# Patient Record
Sex: Female | Born: 1960
Health system: Southern US, Community
[De-identification: ages and names within clinical notes are randomized; demographics above are authoritative.]

## PROBLEM LIST (undated history)

## (undated) DIAGNOSIS — H9319 Tinnitus, unspecified ear: Secondary | ICD-10-CM

## (undated) DIAGNOSIS — B009 Herpesviral infection, unspecified: Secondary | ICD-10-CM

## (undated) DIAGNOSIS — N952 Postmenopausal atrophic vaginitis: Secondary | ICD-10-CM

## (undated) DIAGNOSIS — B3731 Acute candidiasis of vulva and vagina: Secondary | ICD-10-CM

## (undated) DIAGNOSIS — T7840XA Allergy, unspecified, initial encounter: Secondary | ICD-10-CM

## (undated) DIAGNOSIS — L723 Sebaceous cyst: Secondary | ICD-10-CM

## (undated) DIAGNOSIS — H00026 Hordeolum internum left eye, unspecified eyelid: Secondary | ICD-10-CM

## (undated) DIAGNOSIS — G4700 Insomnia, unspecified: Secondary | ICD-10-CM

## (undated) DIAGNOSIS — R251 Tremor, unspecified: Secondary | ICD-10-CM

## (undated) DIAGNOSIS — E785 Hyperlipidemia, unspecified: Secondary | ICD-10-CM

## (undated) DIAGNOSIS — G4733 Obstructive sleep apnea (adult) (pediatric): Secondary | ICD-10-CM

## (undated) DIAGNOSIS — I10 Essential (primary) hypertension: Secondary | ICD-10-CM

## (undated) HISTORY — DX: Insomnia, unspecified: G47.00

## (undated) HISTORY — DX: Essential (primary) hypertension: I10

## (undated) HISTORY — DX: Acute candidiasis of vulva and vagina: B37.31

## (undated) HISTORY — DX: Tinnitus, unspecified ear: H93.19

## (undated) HISTORY — DX: Hyperlipidemia, unspecified: E78.5

## (undated) HISTORY — DX: Herpesviral infection, unspecified: B00.9

## (undated) HISTORY — DX: Allergy, unspecified, initial encounter: T78.40XA

## (undated) HISTORY — DX: Postmenopausal atrophic vaginitis: N95.2

## (undated) HISTORY — DX: Obstructive sleep apnea (adult) (pediatric): G47.33

## (undated) HISTORY — PX: MANDIBLE RECONSTRUCTION: SHX431

## (undated) HISTORY — PX: ABDOMINAL HYSTERECTOMY: SHX81

## (undated) HISTORY — PX: ROTATOR CUFF REPAIR: SHX139

## (undated) HISTORY — DX: Hordeolum internum left eye, unspecified eyelid: H00.026

## (undated) HISTORY — PX: APPENDECTOMY: SHX54

## (undated) HISTORY — DX: Tremor, unspecified: R25.1

---
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
## 1997-11-27 ENCOUNTER — Other Ambulatory Visit: Admission: RE | Admit: 1997-11-27 | Discharge: 1997-11-27 | Payer: Self-pay | Admitting: Obstetrics & Gynecology

## 1998-05-21 ENCOUNTER — Other Ambulatory Visit: Admission: RE | Admit: 1998-05-21 | Discharge: 1998-05-21 | Payer: Self-pay | Admitting: Obstetrics & Gynecology

## 1998-12-11 ENCOUNTER — Other Ambulatory Visit: Admission: RE | Admit: 1998-12-11 | Discharge: 1998-12-11 | Payer: Self-pay | Admitting: Obstetrics & Gynecology

## 1999-12-02 ENCOUNTER — Other Ambulatory Visit: Admission: RE | Admit: 1999-12-02 | Discharge: 1999-12-02 | Payer: Self-pay | Admitting: Obstetrics & Gynecology

## 2000-12-02 ENCOUNTER — Other Ambulatory Visit: Admission: RE | Admit: 2000-12-02 | Discharge: 2000-12-02 | Payer: Self-pay | Admitting: Obstetrics & Gynecology

## 2001-01-21 ENCOUNTER — Ambulatory Visit (HOSPITAL_COMMUNITY): Admission: RE | Admit: 2001-01-21 | Discharge: 2001-01-21 | Payer: Self-pay | Admitting: Obstetrics & Gynecology

## 2001-04-07 ENCOUNTER — Encounter (INDEPENDENT_AMBULATORY_CARE_PROVIDER_SITE_OTHER): Payer: Self-pay | Admitting: Specialist

## 2001-04-07 ENCOUNTER — Inpatient Hospital Stay (HOSPITAL_COMMUNITY): Admission: RE | Admit: 2001-04-07 | Discharge: 2001-04-09 | Payer: Self-pay | Admitting: Obstetrics & Gynecology

## 2001-11-01 ENCOUNTER — Ambulatory Visit (HOSPITAL_COMMUNITY): Admission: RE | Admit: 2001-11-01 | Discharge: 2001-11-01 | Payer: Self-pay | Admitting: Obstetrics & Gynecology

## 2001-11-01 ENCOUNTER — Encounter: Payer: Self-pay | Admitting: Obstetrics & Gynecology

## 2002-03-02 HISTORY — PX: ABDOMINAL HYSTERECTOMY: SHX81

## 2003-07-16 ENCOUNTER — Encounter: Admission: RE | Admit: 2003-07-16 | Discharge: 2003-07-16 | Payer: Self-pay | Admitting: Internal Medicine

## 2005-01-28 ENCOUNTER — Ambulatory Visit: Payer: Self-pay | Admitting: Internal Medicine

## 2005-02-18 ENCOUNTER — Ambulatory Visit: Payer: Self-pay | Admitting: Internal Medicine

## 2005-08-24 ENCOUNTER — Ambulatory Visit: Payer: Self-pay | Admitting: Internal Medicine

## 2009-05-14 ENCOUNTER — Ambulatory Visit (HOSPITAL_COMMUNITY): Admission: RE | Admit: 2009-05-14 | Discharge: 2009-05-14 | Payer: Self-pay | Admitting: Family Medicine

## 2010-03-23 ENCOUNTER — Encounter: Payer: Self-pay | Admitting: Internal Medicine

## 2010-03-23 ENCOUNTER — Encounter: Payer: Self-pay | Admitting: Family Medicine

## 2010-07-18 NOTE — Discharge Summary (Signed)
[facility]  Patient:    Katelyn Carter, Katelyn Carter Visit Number: [id number] MRN: [id number]          Service Type: GYN Location: 9300 [id number] [id number] Attending Physician:  Mickle Mallory Dictated by:   Gerrit Friends. Aldona Bar, M.D. Admit Date:  04/07/2001 Discharge Date: 04/09/2001                             Discharge Summary  DISCHARGE DIAGNOSIS:          Chronic left lower quadrant pain secondary                               to adhesions involving the left tube and ovary.  PROCEDURES:                   1. Total abdominal hysterectomy.                               2. Left salpingo-oophorectomy.                               3. Right salpingectomy (right ovary previously                                  removed).  HISTORY:                      This 50 year old presented with chronic left lower quadrant pain secondary to known adhesions found on laparoscopy in the fall of 2002. The adhesions were very dense and involved the left pelvic sidewall in the area of the left ureter. Decision was made because of the patients persistent discomfort to proceed with definitive surgery and therefore, after a normal preoperative workup, she was admitted for a total abdominal hysterectomy and left salpingo-oophorectomy. Intraoperatively, it was realized that she had previous removal of the right ovary, and therefore, the right fallopian tube was removed as well. The right ovary was probably removed when the patient had an appendectomy in high school. At the time of the laparoscopy, the site of the right ovary appeared as a white fibrous mound and it was assumed that this probably represented a small right ovary. Nonetheless, intraoperatively with this procedure, palpating the area and inspecting the area carefully, it was obvious that this was the site of the right ovary which had been previously removed.  The patients postoperative course was benign. Her discharge hemoglobin  was 10.6 with a white count of 8900 and platelet count of 197,000. Pathologic specimen is still pending at the time of discharge. On the morning of February 8, she was ambulating well, tolerating a regular diet well, having normal bowel and bladder function, and was deemed ready for discharge. Accordingly, her staples were removed, the wound was Steri-Stripped with benzoin.  DISCHARGE MEDICATIONS:        She was given all prescriptions which included:                               1. Vivelle-Dot 0.5 mg.  2. Motrin 600 mg every six hours as needed for                                  pain.                               3. Tylox one to two every four to six hours as                                  needed for sever pain.                               4. Ambien 10 mg for sleep.                               5. Ferrous sulfate 300 mg daily.  DISCHARGE FOLLOWUP:           The patient will return to the office for followup in approximately three to four weeks time or as needed. She was given all explicit instructions at the time of discharge and understood all instructions well.  CONDITION ON DISCHARGE:       Improved. Dictated by:   Gerrit Friends. Aldona Bar, M.D. Attending Physician:  Mickle Mallory DD:  04/09/01 TD:  04/10/01 Job: [phone] [id number]

## 2010-07-18 NOTE — H&P (Signed)
[facility]  Patient:    Katelyn Carter, Katelyn Carter Visit Number: [id number] MRN: [id number]          Service Type: Attending:  Gerrit Friends. Aldona Bar, M.D. Dictated by:   Gerrit Friends. Aldona Bar, M.D. Adm. Date:  04/07/01                           History and Physical  TIME OF OPERATION:            7:30 a.m., Thursday, April 07, 2001.  OFFICE NUMBER:                [id number]  HISTORY OF PRESENT ILLNESS:   Katelyn Carter is a 50 year old patient admitted for a total abdominal hysterectomy and left salpingo-oophorectomy.  She is a gravida 5, para 1, aborta 4.  Because of a nagging left lower quadrant discomfort, she underwent laparoscopy in November 2002, and it was found that there were numerous pelvic adhesions involving the uterus, left tube and ovary, and some as well involving the right tube and ovary.  The adhesions involving the left tube and ovary, dome of the bladder, and uterus are likely the etiology of the patients persistent nagging discomfort.  She is admitted at this time for a total abdominal hysterectomy, lysis of adhesions, and left salpingo-oophorectomy to remedy the situation.  Her last menstrual period was approximately one week ago.  In addition to the problem with the left lower quadrant discomfort, she has in the past had a Pap smear that showed a few atypical cells (1999), but all subsequent Pap smears since then have been within normal limits.  OBSTETRICAL HISTORY:          Complicated by significant group B strep infections.  She had a pregnancy loss with her first and second pregnancies. In February 1991, she delivered a 36-week, 5 pound 6 ounce female infant who is her only child.  In February 1993 and again in June 1997, she had pregnancy loss at 5-1/2 months gestation, presumably related to chorioamnionitis, group B strep, and preterm premature rupture of membranes.  The planned procedure has been discussed with her in great detail, and she understands the  potential benefits and potential risks of such procedure.  ALLERGIES:                    No known allergies.  PAST MEDICAL HISTORY:         Previous procedures as above.  She has no serious illnesses.  Her past medical history is unremarkable with exception of the above.  MEDICATIONS:                  None on a regular basis.  SOCIAL HISTORY:               Essentially negative.  FAMILY HISTORY:               Essentially negative.  REVIEW OF SYSTEMS:            Unremarkable.  PHYSICAL EXAMINATION:  GENERAL:                      At the time of admission, a well-developed white female.  VITAL SIGNS:                  Blood pressure 110/70, pulse 80 and regular, respirations 18 and regular, temperature 98.2.  HEENT:  Negative.  NECK:                         Thyroid not enlarged.  CHEST:                        Clear.  CARDIOVASCULAR:               Regular rate and rhythm.  BREASTS:                      Negative.  ABDOMEN:                      The abdomen is soft.  There is some discomfort to deep palpation in the left lower quadrant.  PELVIC:                       Uterus upper limits of normal size, slightly tender to movement.  There is tenderness in the left adnexa, but no specific masses are appreciated.  The right adnexa has no significant masses appreciated, minimal tenderness.  Rectovaginal exam confirmatory.  EXTREMITIES:                  Negative.  NEUROLOGIC:                   Physiologic.  IMPRESSION:                   1. Known pelvic adhesions, probably associated                                  with chronic left lower quadrant pain.                               2. Previous abnormal Pap smear on one occasion.                               3. Poor obstetrical history associated with                                  significant group B Streptococcus.  PLAN:                         Total abdominal hysterectomy, lysis of adhesions, left  salpingo-oophorectomy, doubt necessity to do a right salpingo-oophorectomy.  As mentioned, the patient understands risks and benefits of the procedures and is ready to proceed. Dictated by:   Gerrit Friends. Aldona Bar, M.D. Attending:  Gerrit Friends. Aldona Bar, M.D. DD:  04/05/01 TD:  04/05/01 Job: [id number]

## 2010-07-18 NOTE — Op Note (Signed)
[facility]  Patient:    Katelyn Carter, Katelyn Carter Visit Number: [id number] MRN: [id number]          Service Type: DSU Location: [facility] Attending Physician:  Mickle Mallory Dictated by:   Gerrit Friends. Aldona Bar, M.D. Proc. Date: 01/21/01 Admit Date:  01/21/2001                             Operative Report  PREOPERATIVE DIAGNOSIS:       Chronic left lower quadrant pain.  POSTOPERATIVE DIAGNOSES:      1. Chronic left lower quadrant pain.                               2. Numerous pelvic adhesions involving the                                  uterus, left tube and ovary, right tube and                                  ovary.  PROCEDURE:                    Diagnostic laparoscopy.  SURGEON:                      Gerrit Friends. Aldona Bar, M.D.  ANESTHESIA:                   General endotracheal.  HISTORY:                      This 50 year old gravida 5, para 1, abortus 4 has persistent, sporadic, chronic left lower quadrant discomfort, and evaluation essentially was negative.  Evaluation included an ultrasound, CA-125 and pelvic exam.  She is taken to the operating room now for further evaluation.  It is of interest that, in her distant past history, she relates what she thought was a left oophorectomy, which most likely was a left oophorocystectomy.  Ultrasound did show the presence of a left and right ovary.  OPERATIVE FINDINGS:           The liver edge and undersurface of the diaphragm appeared normal.  The appendix was not visualized - surgically absent.  The uterus was globular and upper limits of normal size, consistent with a small fibroid as noted on the ultrasound.  There were numerous adhesions involving the dome of the bladder, especially on the left.  There was also what appeared to be increased vascularity in that area.  The right fallopian tube also had some increased vascularity.  The right ovary itself appeared very small. There were really no significant  adhesions involving the right tube and ovary. The left tube and ovary appeared fairly normal with the exception of terminal adhesions where the left tube was stuck densely to the left pelvic side wall. The left ovary appeared normal in size.  There was some distortion of the anatomy in the area of the left round ligament as it left the uterus, more so involved with the adhesions in the area of the dome of the bladder.  The cul-de-sac appeared fairly free.  There were some areas of increased vascularity noted on the posterior leaves of the  left broad ligament, probably secondary to the adhesions on the left side.  DESCRIPTION OF PROCEDURE:     The patient was taken to the operating room. After the satisfactory induction of general endotracheal anesthesia, she was prepped and draped, having been placed in the modified lithotomy position in the short Allen stirrups.  The bladder was drained of clear urine through a red rubber catheter in in-and-out fashion.  A Hulka tenaculum was placed on the cervix for uterine mobility during the procedure.  At this time, after the patient was adequately draped, laparoscopy was begun. A 1 cm subumbilical midline transverse skin incision was made and, through this, the large trocar and sleeve were introduced.  The large trocar was removed.  Through the large sleeve, a laparoscope was introduced, with good visualization of intra-abdominal structures.  At this time, pneumoperitoneum was created with approximately 3 L of carbon dioxide gas.  Through a 5 mm suprapubic midline transverse skin incision, the accessory trocar and sleeve were introduced under direct visualization.  Through the accessory sleeve, once the accessory trocar had been removed, the grasping instrument was introduced.  At this time, visualization of anatomy was carried out as noted in the operative findings.  It was decided, after reviewing everything, that the adhesions involving  the distal portion of the left tube were so densely adhered to the side wall that sharp dissection was probably advisable.  In addition, the adhesions involving the dome of the bladder, especially on the left, were such that sharp dissection likewise was preferable to the surgeon, so diagnostic laparoscopy was all that was entertained.  At the conclusion of the procedure, once all anatomy had been visualized, the accessory sleeve was removed under direct visualization after the grasping instrument was removed.  The undersurface of the peritoneum was dry.  The laparoscope at this time was removed from the large sleeve.  Pneumoperitoneum was reduced.  The large sleeve was removed and the skin incisions were closed with 4-0 Vicryl in interrupted subcuticular fashion.  Band-aids were applied. The Hulka tenaculum was removed from the cervix.  The patient at this time was transported to the recovery room in satisfactory condition, having tolerated the procedure well.  ESTIMATED BLOOD LOSS:         25 cc.  COUNTS:                       Correct x 2.  DISPOSITION:                  The patient will be discharged to home with a prescription for Tylox to use 1-2 q.4-6h. and Compazine rectal suppositories, 25 mg, to use one per rectum q.6-8h. as needed for nausea and vomiting.  She will be asked to return to the office in several weeks for follow up.  The option of further surgery will be entertained and discussed with the patient.  CONDITION ON ARRIVAL TO THE RECOVERY ROOM:         Satisfactory. Dictated by:   Gerrit Friends. Aldona Bar, M.D. Attending Physician:  Mickle Mallory DD:  01/21/01 TD:  01/21/01 Job: [id number]

## 2010-07-18 NOTE — Op Note (Signed)
[facility]  Patient:    Katelyn Carter, Katelyn Carter Visit Number: [id number] MRN: [id number]          Service Type: GYN Location: 9300 [id number] 01 Attending Physician:  Mickle Mallory Dictated by:   Gerrit Friends. Aldona Bar, M.D. Proc. Date: 04/07/01 Admit Date:  04/07/2001                             Operative Report  PREOPERATIVE DIAGNOSES:       Known pelvic adhesions, chronic left lower quadrant pain.  POSTOPERATIVE DIAGNOSES:      Known pelvic adhesions, chronic left lower quadrant pain.  PROCEDURE:                    Total abdominal hysterectomy, left salpingo-oophorectomy, lysis of adhesions, right salpingectomy-right ovary never identified, ?previously removed, ?congenitally absent.  What had been presumed to be right ovary on previous laparoscopy was what appeared some fibrous tissue adjacent to the right cornu of the uterus.  SURGEON:                      Gerrit Friends. Aldona Bar, M.D.  ASSISTANT:                    Teena Irani. Odis Luster, M.D.  ANESTHESIA:                   General endotracheal.  PROCEDURE:                    Patient was taken to the operating room where after the satisfactory induction of general endotracheal anesthesia she was prepped and draped having been placed in the supine position in the usual manner for abdominal surgery.  A Foley catheter was inserted as part of the prep.  Once the patient was adequately draped the procedure was begun.  A Pfannenstiel incision was made and with minimal difficulty dissected down sharply to and through the fascia in a low transverse fascia with hemostasis created at each layer.  Subfascial space was created inferiorly and superiorly.  Muscles separated in the midline.  Peritoneum identified and entered appropriate with care taken to avoid the bowel superiorly and bladder inferiorly.  At this time the upper abdomen was inspected.  No pathologic findings were noted.  The uterus itself was posterior, had a  lot of increased vascularity noted.  The left ovary, as previously noted, was stuck to the left pelvic side wall.  The left tube appeared normal.  The right fallopian tube appeared normal.  The right ovary was never specifically identified.  What was felt to be the right ovary on laparoscopy was only a fibrous mound of tissue located near the right cornu.  Originally, the plan had been to carry out a left salpingo-oophorectomy and total abdominal hysterectomy but because of the intraoperative findings, decision was made to proceed with removal of the right tube and presumably the fibrous tissue which may have been the right ovary as well as the left tube and ovary and the uterus.  Accordingly, after the retractor was placed and bowel was packed off without difficulty, hysterectomy was begun.  The corners of the uterus were elevated with long Kelly clamps and at this time the round ligaments identified, clamped, cut, suture secured, and the bladder flap dissected with the Bovie.  At this time the adhesions involving the left ovary were sharply lysed once  identification of the left ureter was carried out.  The left infundibulopelvic ligament was then isolated, clamped, cut, then doubly suture secured.  At this time the right round ligament was clamped, cut, and suture secured and the remainder of the bladder flap was dissected off of the lower segment.  The right fallopian tube appeared fairly normal.  It was fairly close to the right round ligament and again there was a little fibrous mound of tissue adjacent to the right cornu which was felt to be a small right ovary on laparoscopy, but indeed may or may not have been. Anything else resembling a right ovary was not seen in the pelvis.  Decision was made to remove the right tube in its entirety.  Accordingly, the right infundibulopelvic pedicle was isolated and after identification of the right ureter was clamped, cut, and double suture  secured.  At this time the uterine artery pedicles were skeletonized and then clamped, cut, and secure secured with 0 Vicryl suture.  Additional parametrial bites were taken in a similar fashion bilaterally down to the vaginal angle which was secured using a curved Heaney and a figure-of-eight 0 Vicryl suture.  At this time the anterior vagina was entered below the cervix and the cervix and uterus removed in its entirety.  After specimen was passed off the vaginal cuff was attended to accordingly.  The vaginal angles were secured with figure-of-eight 0 Vicryl sutures and the vaginal cuff was closed with interrupted 0 Vicryl in a figure-of-eight fashion as well.  At this time profuse irrigation was carried out with excellent hemostasis noted.  The site where the left ovary was stuck to the pelvic side wall was rendered hemostatic with a figure-of-eight 2-0 Vicryl.  Both ureters were again identified.  After the pelvis was thoroughly inspected and noted to be dry the hysterectomy portion of the procedure was over and packs were removed.  The appendix was looked for, but was noted to be surgically absent.  All packs were removed at this time as was the retractor. With all counts being correct and no foreign bodies noted to be remaining in the abdominal cavity, closure of the abdomen was then carried out in layers. The abdominoperitoneum was closed with 0 Vicryl in a running fashion, the muscles secured with same.  Assurance of subfascial hemostasis, the fascia was then reapproximated with 0 Vicryl from angle to midline bilaterally. Subcutaneous tissue was then rendered hemostatic and staples were used to close the skin.  Sterile pressure dressing was applied.  Patient at this time was transported to the recovery room condition after having tolerated procedure well.  Estimated blood loss 200 cc.  All counts correct x2.  Upon arrival to the recovery room patient had good urine output which was  clear and appeared to have tolerated the procedure well.  Condition on arrival to the recovery room:  Satisfactory. Dictated by:   Gerrit Friends. Aldona Bar, M.D.  Attending Physician:  Mickle Mallory DD:  04/07/01 TD:  04/07/01 Job: [id number]

## 2012-04-25 ENCOUNTER — Ambulatory Visit: Payer: BC Managed Care – PPO | Admitting: Emergency Medicine

## 2012-04-25 VITALS — BP 120/80 | HR 73 | Temp 98.4°F | Resp 18 | Ht 62.0 in | Wt 166.0 lb

## 2012-04-25 DIAGNOSIS — H01009 Unspecified blepharitis unspecified eye, unspecified eyelid: Secondary | ICD-10-CM

## 2012-04-25 DIAGNOSIS — H01002 Unspecified blepharitis right lower eyelid: Secondary | ICD-10-CM

## 2012-04-25 MED ORDER — TOBRAMYCIN-DEXAMETHASONE 0.3-0.1 % OP SUSP: 1.0000 [drp] | OPHTHALMIC | Status: DC

## 2012-04-25 NOTE — Progress Notes (Signed)
[facility] [address] [place] [address] [phone]  Date:  04/25/2012   Name:  Katelyn Carter   DOB:  02-14-1961   MRN:  [id number]  PCP:  No primary provider on file.    Chief Complaint: right eye irritation   History of Present Illness:  Katelyn Carter is a 52 y.o. very pleasant female patient who presents with the following:  Weekend long duration of swollen itchy uncomfortable right lower lid.  No injection, gluing or drainage. No visual symptoms no cough or coryza.  No fever or chills.  No new contact products or personal care products.  No history of injury.  There is no problem list on file for this patient.   History reviewed. No pertinent past medical history.  Past Surgical History  Procedure Laterality Date  . Appendectomy    . Abdominal hysterectomy      History  Substance Use Topics  . Smoking status: Never Smoker   . Smokeless tobacco: Not on file  . Alcohol Use: 1.5 oz/week    3 drink(s) per week    Family History  Problem Relation Age of Onset  . Hyperlipidemia Mother   . Cancer Father     kidney    Allergies  Allergen Reactions  . Sudafed (Pseudoephedrine Hcl)     jittery    Medication list has been reviewed and updated.  No current outpatient prescriptions on file prior to visit.   No current facility-administered medications on file prior to visit.    Review of Systems:  As per HPI, otherwise negative.    Physical Examination: Filed Vitals:   04/25/12 1144  BP: 120/80  Pulse: 73  Temp: 98.4 F (36.9 C)  Resp: 18   Filed Vitals:   04/25/12 1144  Height: 5\' 2"  (1.575 m)  Weight: 166 lb (75.297 kg)   Body mass index is 30.35 kg/(m^2). Ideal Body Weight: Weight in (lb) to have BMI = 25: 136.4   GEN: WDWN, NAD, Non-toxic, Alert & Oriented x 3 HEENT: Atraumatic, Normocephalic. Right lower lid swollen and red. Ears and Nose: No external deformity. EXTR: No clubbing/cyanosis/edema NEURO:  Normal gait.  PSYCH: Normally interactive. Conversant. Not depressed or anxious appearing.  Calm demeanor.    Assessment and Plan: Blepharitis   Carmelina Dane, MD

## 2013-05-08 ENCOUNTER — Encounter: Payer: Self-pay | Admitting: General Surgery

## 2013-05-08 DIAGNOSIS — E669 Obesity, unspecified: Secondary | ICD-10-CM

## 2013-05-08 DIAGNOSIS — G4733 Obstructive sleep apnea (adult) (pediatric): Secondary | ICD-10-CM

## 2013-05-09 ENCOUNTER — Ambulatory Visit (INDEPENDENT_AMBULATORY_CARE_PROVIDER_SITE_OTHER): Payer: BC Managed Care – PPO | Admitting: Cardiology

## 2013-05-09 ENCOUNTER — Encounter: Payer: Self-pay | Admitting: Cardiology

## 2013-05-09 VITALS — BP 100/69 | HR 76 | Ht 62.0 in | Wt 163.0 lb

## 2013-05-09 DIAGNOSIS — G4733 Obstructive sleep apnea (adult) (pediatric): Secondary | ICD-10-CM

## 2013-05-09 DIAGNOSIS — E669 Obesity, unspecified: Secondary | ICD-10-CM

## 2013-05-09 NOTE — Progress Notes (Signed)
  [address] [place], [place]  [address] Phone: [phone] Fax:  [phone]  Date:  05/09/2013   ID:  Katelyn LaxBeth M Scerbo, DOB [birth year], MRN [id number]  PCP:  Allean FoundSMITH,CANDACE THIELE, MD  Sleep Medicine:    Armanda Magicraci Karlyn Glasco, MD   History of Present Illness: Katelyn Carter is a 53 y.o. female with a history of OSA on CPAP who presents today for followup.  She is doing well.  She states that she still does not like using her CPAP.  She tolerates the mask and feels the pressure is adequate.  She has no day time sleepiness and feels rested when she gets up.  She is going to have jaw surgery next month because she wants to get off CPAP.     Wt Readings from Last 3 Encounters:  05/09/13 163 lb (73.936 kg)  04/25/12 166 lb (75.297 kg)     Past Medical History  Diagnosis Date  . OSA (obstructive sleep apnea)     Current Outpatient Prescriptions  Medication Sig Dispense Refill  . estradiol (VIVELLE-DOT) 0.1 MG/24HR Place 1 patch onto the skin 2 (two) times a week.       No current facility-administered medications for this visit.    Allergies:    Allergies  Allergen Reactions  . Sudafed [Pseudoephedrine Hcl]     jittery    Social History:  The patient  reports that she has never smoked. She does not have any smokeless tobacco history on file. She reports that she drinks about 1.5 ounces of alcohol per week. She reports that she does not use illicit drugs.   Family History:  The patient's family history includes Cancer in her father; Hyperlipidemia in her mother.   ROS:  Please see the history of present illness.      All other systems reviewed and negative.   PHYSICAL EXAM: VS:  BP 100/69  Pulse 76  Ht 5\' 2"  (1.575 m)  Wt 163 lb (73.936 kg)  BMI 29.81 kg/m2 Well nourished, well developed, in no acute distress HEENT: normal Neck: no JVD Cardiac:  normal S1, S2; RRR; no murmur Lungs:  clear to auscultation bilaterally, no wheezing, rhonchi or rales Abd: soft,  nontender, no hepatomegaly Ext: no edema Skin: warm and dry Neuro:  CNs 2-12 intact, no focal abnormalities noted       ASSESSMENT AND PLAN:  1. OSA on CPAP and tolerating well but she does not like it.  She is going to move forward with jaw surgery and we will need to have a followup PSG once the surgery is done.    Followup with me in 1 month after jaw surgery  Signed, Armanda Magicraci Starsky Nanna, MD 05/09/2013 2:39 PM

## 2013-05-09 NOTE — Patient Instructions (Signed)
Your physician recommends that you continue on your current medications as directed. Please refer to the Current Medication list given to you today.  Your physician recommends that you schedule a follow-up appointment in: late May with Dr. Mayford Knifeurner.

## 2013-05-22 ENCOUNTER — Encounter: Payer: Self-pay | Admitting: Cardiology

## 2013-07-25 ENCOUNTER — Ambulatory Visit: Payer: BC Managed Care – PPO | Admitting: Cardiology

## 2013-12-11 ENCOUNTER — Other Ambulatory Visit: Payer: Self-pay | Admitting: Obstetrics & Gynecology

## 2014-03-02 HISTORY — PX: MANDIBLE RECONSTRUCTION: SHX431

## 2016-03-14 DIAGNOSIS — Z23 Encounter for immunization: Secondary | ICD-10-CM | POA: Diagnosis not present

## 2016-11-10 DIAGNOSIS — Z01419 Encounter for gynecological examination (general) (routine) without abnormal findings: Secondary | ICD-10-CM | POA: Diagnosis not present

## 2016-11-10 DIAGNOSIS — Z0000 Encounter for general adult medical examination without abnormal findings: Secondary | ICD-10-CM | POA: Diagnosis not present

## 2016-11-10 DIAGNOSIS — Z6828 Body mass index (BMI) 28.0-28.9, adult: Secondary | ICD-10-CM | POA: Diagnosis not present

## 2016-11-10 DIAGNOSIS — Z1231 Encounter for screening mammogram for malignant neoplasm of breast: Secondary | ICD-10-CM | POA: Diagnosis not present

## 2016-12-03 DIAGNOSIS — Z23 Encounter for immunization: Secondary | ICD-10-CM | POA: Diagnosis not present

## 2017-11-08 DIAGNOSIS — L723 Sebaceous cyst: Secondary | ICD-10-CM | POA: Diagnosis not present

## 2017-11-22 DIAGNOSIS — Z0000 Encounter for general adult medical examination without abnormal findings: Secondary | ICD-10-CM | POA: Diagnosis not present

## 2017-11-22 DIAGNOSIS — Z01419 Encounter for gynecological examination (general) (routine) without abnormal findings: Secondary | ICD-10-CM | POA: Diagnosis not present

## 2017-11-22 DIAGNOSIS — Z6829 Body mass index (BMI) 29.0-29.9, adult: Secondary | ICD-10-CM | POA: Diagnosis not present

## 2017-11-22 DIAGNOSIS — Z1231 Encounter for screening mammogram for malignant neoplasm of breast: Secondary | ICD-10-CM | POA: Diagnosis not present

## 2017-12-02 ENCOUNTER — Ambulatory Visit: Payer: Self-pay | Admitting: Surgery

## 2017-12-02 DIAGNOSIS — L723 Sebaceous cyst: Secondary | ICD-10-CM | POA: Diagnosis not present

## 2017-12-02 NOTE — H&P (View-Only) (Signed)
Katelyn Carter Documented: 12/02/2017 9:51 AM Location: [facility] Patient #: [id number] DOB: [birth year] Married / Language: English / Race: White Female  History of Present Illness (Najiyah Paris A. Fredricka Bonine MD; 12/02/2017 10:09 AM) Patient words: This is a very pleasant 57 year old woman who has had a subcutaneous cyst on her left upper back for several months. It initially did not really bother her but recently became more uncomfortable. She did go to a equal walking clinic where the provider apparently tried to express the cysts, she does not have anything came out. Following this it was sort of red and angry. She went to see her primary care doctor who did prescribe some antibiotics, she feels that it has improved since then but it remains itchy and uncomfortable.  The patient is a 57 year old female.   Allergies (Armen Ferguson, CMA; 12/02/2017 9:54 AM) Pseudoephedrine HCl (Deter) *NASAL AGENTS - SYSTEMIC AND TOPICAL* sudafed  Medication History (Armen Ferguson, CMA; 12/02/2017 9:56 AM) Estradiol (0.1MG /24HR Patch TW, Transdermal) Active. Medications Reconciled  Pregnancy / Birth History Renee Ramus, CMA; 12/02/2017 9:51 AM) Age at menarche 10 years.  Other Problems Renee Ramus, CMA; 12/02/2017 9:51 AM) Oophorectomy Bilateral. Sleep Apnea     Review of Systems (Corrion Stirewalt A. Gillian Kluever MD; 12/02/2017 10:09 AM) Gastrointestinal Not Present- Abdominal Pain, Bloating, Bloody Stool, Change in Bowel Habits, Chronic diarrhea, Constipation, Difficulty Swallowing, Excessive gas, Gets full quickly at meals, Hemorrhoids, Indigestion, Nausea, Rectal Pain and Vomiting. Musculoskeletal Present- Back Pain. Not Present- Joint Pain, Joint Stiffness, Muscle Pain, Muscle Weakness and Swelling of Extremities. All other systems negative  Vitals (Armen Ferguson CMA; 12/02/2017 9:53 AM) 12/02/2017 9:52 AM Weight: 154.5 lb Height: 61in Body Surface Area: 1.69 m Body Mass Index: 29.19  kg/m  Temp.: 97.18F  Pulse: 74 (Regular)  P.OX: 98% (Room air) BP: 102/74 (Sitting, Left Arm, Standard)      Physical Exam (Larnce Schnackenberg A. Fredricka Bonine MD; 12/02/2017 10:10 AM)  The physical exam findings are as follows: Note:Gen: alert and well appearing Eye: extraocular motion intact, no scleral icterus ENT: moist mucus membranes, dentition intact Neck: no mass or thyromegaly Chest: unlabored respirations, symmetrical air entry CV: regular rate and rhythm, no pedal edema MSK: strength symmetrical throughout, no deformity Neuro: grossly intact, normal gait Psych: normal mood and affect, appropriate insight Skin: warm and dry, there is a 2 cm subcutaneous mobile cyst in the mid back just left of midline. May have some residual bruising inferiorly but does not appear actively infected    Assessment & Plan (Rondell Pardon A. Alyissa Whidbee MD; 12/02/2017 10:11 AM)  SEBACEOUS CYST (L72.3) Story: She would like it excised. I discussed the procedure in detail with her including the risks of bleeding, infection, pain, scarring, wound dehiscence, recurrence of the cyst. Questions were welcomed and answered.

## 2017-12-02 NOTE — H&P (Signed)
Katelyn Carter Documented: 12/02/2017 9:51 AM Location: [facility] Patient #: [id number] DOB: May 28, 1960 Married / Language: English / Race: White Female  History of Present Illness (Teshara Moree A. Fredricka Bonine MD; 12/02/2017 10:09 AM) Patient words: This is a very pleasant 57 year old woman who has had a subcutaneous cyst on her left upper back for several months. It initially did not really bother her but recently became more uncomfortable. She did go to a equal walking clinic where the provider apparently tried to express the cysts, she does not have anything came out. Following this it was sort of red and angry. She went to see her primary care doctor who did prescribe some antibiotics, she feels that it has improved since then but it remains itchy and uncomfortable.  The patient is a 57 year old female.   Allergies (Armen Ferguson, CMA; 12/02/2017 9:54 AM) Pseudoephedrine HCl (Deter) *NASAL AGENTS - SYSTEMIC AND TOPICAL* sudafed  Medication History (Armen Ferguson, CMA; 12/02/2017 9:56 AM) Estradiol (0.1MG /04VW Patch TW, Transdermal) Active. Medications Reconciled  Pregnancy / Birth History Renee Ramus, CMA; 12/02/2017 9:51 AM) Age at menarche 10 years.  Other Problems Renee Ramus, CMA; 12/02/2017 9:51 AM) Oophorectomy Bilateral. Sleep Apnea     Review of Systems (Timothy Trudell A. Jassica Zazueta MD; 12/02/2017 10:09 AM) Gastrointestinal Not Present- Abdominal Pain, Bloating, Bloody Stool, Change in Bowel Habits, Chronic diarrhea, Constipation, Difficulty Swallowing, Excessive gas, Gets full quickly at meals, Hemorrhoids, Indigestion, Nausea, Rectal Pain and Vomiting. Musculoskeletal Present- Back Pain. Not Present- Joint Pain, Joint Stiffness, Muscle Pain, Muscle Weakness and Swelling of Extremities. All other systems negative  Vitals (Armen Ferguson CMA; 12/02/2017 9:53 AM) 12/02/2017 9:52 AM Weight: 154.5 lb Height: 61in Body Surface Area: 1.69 m Body Mass Index: 29.19  kg/m  Temp.: 97.56F  Pulse: 74 (Regular)  P.OX: 98% (Room air) BP: 102/74 (Sitting, Left Arm, Standard)      Physical Exam (Anvith Mauriello A. Fredricka Bonine MD; 12/02/2017 10:10 AM)  The physical exam findings are as follows: Note:Gen: alert and well appearing Eye: extraocular motion intact, no scleral icterus ENT: moist mucus membranes, dentition intact Neck: no mass or thyromegaly Chest: unlabored respirations, symmetrical air entry CV: regular rate and rhythm, no pedal edema MSK: strength symmetrical throughout, no deformity Neuro: grossly intact, normal gait Psych: normal mood and affect, appropriate insight Skin: warm and dry, there is a 2 cm subcutaneous mobile cyst in the mid back just left of midline. May have some residual bruising inferiorly but does not appear actively infected    Assessment & Plan (Chanya Chrisley A. Kyliana Standen MD; 12/02/2017 10:11 AM)  SEBACEOUS CYST (L72.3) Story: She would like it excised. I discussed the procedure in detail with her including the risks of bleeding, infection, pain, scarring, wound dehiscence, recurrence of the cyst. Questions were welcomed and answered.

## 2017-12-13 ENCOUNTER — Other Ambulatory Visit: Payer: Self-pay

## 2017-12-13 ENCOUNTER — Encounter (HOSPITAL_BASED_OUTPATIENT_CLINIC_OR_DEPARTMENT_OTHER): Payer: Self-pay | Admitting: *Deleted

## 2017-12-14 DIAGNOSIS — Z23 Encounter for immunization: Secondary | ICD-10-CM | POA: Diagnosis not present

## 2017-12-21 ENCOUNTER — Encounter (HOSPITAL_BASED_OUTPATIENT_CLINIC_OR_DEPARTMENT_OTHER): Payer: Self-pay | Admitting: Anesthesiology

## 2017-12-21 ENCOUNTER — Encounter (HOSPITAL_BASED_OUTPATIENT_CLINIC_OR_DEPARTMENT_OTHER)
Admission: RE | Disposition: A | Discharge status: Home / Self Care | Payer: Self-pay | Source: Ambulatory Visit | Attending: Surgery

## 2017-12-21 ENCOUNTER — Ambulatory Visit (HOSPITAL_BASED_OUTPATIENT_CLINIC_OR_DEPARTMENT_OTHER): Payer: BLUE CROSS/BLUE SHIELD | Admitting: Anesthesiology

## 2017-12-21 ENCOUNTER — Ambulatory Visit (HOSPITAL_BASED_OUTPATIENT_CLINIC_OR_DEPARTMENT_OTHER)
Admission: RE | Admit: 2017-12-21 | Discharge: 2017-12-21 | Disposition: A | Discharge status: Home / Self Care | Payer: BLUE CROSS/BLUE SHIELD | Source: Ambulatory Visit | Attending: Surgery | Admitting: Surgery

## 2017-12-21 DIAGNOSIS — G4730 Sleep apnea, unspecified: Secondary | ICD-10-CM | POA: Insufficient documentation

## 2017-12-21 DIAGNOSIS — L720 Epidermal cyst: Secondary | ICD-10-CM | POA: Insufficient documentation

## 2017-12-21 DIAGNOSIS — L723 Sebaceous cyst: Secondary | ICD-10-CM | POA: Insufficient documentation

## 2017-12-21 HISTORY — DX: Sebaceous cyst: L72.3

## 2017-12-21 HISTORY — PX: MASS EXCISION: SHX2000

## 2017-12-21 SURGERY — EXCISION MASS
Anesthesia: Monitor Anesthesia Care | Site: Back | Laterality: Left

## 2017-12-21 MED ORDER — LIDOCAINE 2% (20 MG/ML) 5 ML SYRINGE
INTRAMUSCULAR | Status: AC
  Filled 2017-12-21: qty 5

## 2017-12-21 MED ORDER — GABAPENTIN 300 MG PO CAPS
300.0000 mg | ORAL_CAPSULE | ORAL | Status: AC
  Administered 2017-12-21: 300 mg via ORAL

## 2017-12-21 MED ORDER — PROMETHAZINE HCL 25 MG/ML IJ SOLN: 6.2500 mg | INTRAMUSCULAR | Status: DC | PRN

## 2017-12-21 MED ORDER — MIDAZOLAM HCL 5 MG/5ML IJ SOLN: INTRAMUSCULAR | Status: DC | PRN

## 2017-12-21 MED ORDER — GABAPENTIN 300 MG PO CAPS
ORAL_CAPSULE | ORAL | Status: AC
  Filled 2017-12-21: qty 1

## 2017-12-21 MED ORDER — CEFAZOLIN SODIUM-DEXTROSE 2-3 GM-%(50ML) IV SOLR
INTRAVENOUS | Status: DC | PRN
  Administered 2017-12-21: 2 g via INTRAVENOUS

## 2017-12-21 MED ORDER — DEXAMETHASONE SODIUM PHOSPHATE 10 MG/ML IJ SOLN
INTRAMUSCULAR | Status: AC
  Filled 2017-12-21: qty 1

## 2017-12-21 MED ORDER — ACETAMINOPHEN 500 MG PO TABS
1000.0000 mg | ORAL_TABLET | ORAL | Status: AC
  Administered 2017-12-21: 1000 mg via ORAL

## 2017-12-21 MED ORDER — MIDAZOLAM HCL 2 MG/2ML IJ SOLN: 1.0000 mg | INTRAMUSCULAR | Status: DC | PRN

## 2017-12-21 MED ORDER — SCOPOLAMINE 1 MG/3DAYS TD PT72: 1.0000 | MEDICATED_PATCH | Freq: Once | TRANSDERMAL | Status: DC | PRN

## 2017-12-21 MED ORDER — FENTANYL CITRATE (PF) 100 MCG/2ML IJ SOLN
INTRAMUSCULAR | Status: AC
  Filled 2017-12-21: qty 2

## 2017-12-21 MED ORDER — TRAMADOL HCL 50 MG PO TABS: 50.0000 mg | ORAL_TABLET | Freq: Four times a day (QID) | ORAL | 0 refills | Status: AC | PRN

## 2017-12-21 MED ORDER — ONDANSETRON HCL 4 MG/2ML IJ SOLN
INTRAMUSCULAR | Status: AC
  Filled 2017-12-21: qty 2

## 2017-12-21 MED ORDER — LACTATED RINGERS IV SOLN
INTRAVENOUS | Status: DC
  Administered 2017-12-21: 08:00:00 via INTRAVENOUS

## 2017-12-21 MED ORDER — FENTANYL CITRATE (PF) 100 MCG/2ML IJ SOLN: 50.0000 ug | INTRAMUSCULAR | Status: DC | PRN

## 2017-12-21 MED ORDER — MIDAZOLAM HCL 2 MG/2ML IJ SOLN
INTRAMUSCULAR | Status: AC
  Filled 2017-12-21: qty 2

## 2017-12-21 MED ORDER — PROPOFOL 500 MG/50ML IV EMUL
INTRAVENOUS | Status: DC | PRN
  Administered 2017-12-21: 25 ug/kg/min via INTRAVENOUS

## 2017-12-21 MED ORDER — ACETAMINOPHEN 500 MG PO TABS
ORAL_TABLET | ORAL | Status: AC
  Filled 2017-12-21: qty 2

## 2017-12-21 MED ORDER — FENTANYL CITRATE (PF) 100 MCG/2ML IJ SOLN: 25.0000 ug | INTRAMUSCULAR | Status: DC | PRN

## 2017-12-21 MED ORDER — ONDANSETRON HCL 4 MG/2ML IJ SOLN
INTRAMUSCULAR | Status: DC | PRN
  Administered 2017-12-21: 4 mg via INTRAVENOUS

## 2017-12-21 MED ORDER — CEFAZOLIN SODIUM-DEXTROSE 2-4 GM/100ML-% IV SOLN: 2.0000 g | INTRAVENOUS | Status: DC

## 2017-12-21 MED ORDER — CHLORHEXIDINE GLUCONATE 4 % EX LIQD: 60.0000 mL | Freq: Once | CUTANEOUS | Status: DC

## 2017-12-21 MED ORDER — BUPIVACAINE-EPINEPHRINE (PF) 0.25% -1:200000 IJ SOLN
INTRAMUSCULAR | Status: DC | PRN
  Administered 2017-12-21: 3 mL via PERINEURAL

## 2017-12-21 MED ORDER — MIDAZOLAM HCL 5 MG/5ML IJ SOLN
INTRAMUSCULAR | Status: DC | PRN
  Administered 2017-12-21 (×2): 1 mg via INTRAVENOUS

## 2017-12-21 MED ORDER — PROPOFOL 10 MG/ML IV BOLUS
INTRAVENOUS | Status: AC
  Filled 2017-12-21: qty 20

## 2017-12-21 MED ORDER — FENTANYL CITRATE (PF) 100 MCG/2ML IJ SOLN
INTRAMUSCULAR | Status: DC | PRN
  Administered 2017-12-21: 50 ug via INTRAVENOUS

## 2017-12-21 SURGICAL SUPPLY — 51 items
ADH SKN CLS APL DERMABOND .7 (GAUZE/BANDAGES/DRESSINGS) ×2
BLADE CLIPPER SURG (BLADE) IMPLANT
BLADE SURG 15 STRL LF DISP TIS (BLADE) ×2 IMPLANT
BLADE SURG 15 STRL SS (BLADE) ×4
CANISTER SUCT 1200ML W/VALVE (MISCELLANEOUS) IMPLANT
CHLORAPREP W/TINT 26ML (MISCELLANEOUS) ×4 IMPLANT
CLOSURE STERI-STRIP 1/2X4 (GAUZE/BANDAGES/DRESSINGS) ×1
CLSR STERI-STRIP ANTIMIC 1/2X4 (GAUZE/BANDAGES/DRESSINGS) ×3 IMPLANT
COVER BACK TABLE 60X90IN (DRAPES) ×4 IMPLANT
COVER MAYO STAND STRL (DRAPES) ×4 IMPLANT
COVER WAND RF STERILE (DRAPES) IMPLANT
DECANTER SPIKE VIAL GLASS SM (MISCELLANEOUS) IMPLANT
DERMABOND ADVANCED (GAUZE/BANDAGES/DRESSINGS) ×2
DERMABOND ADVANCED .7 DNX12 (GAUZE/BANDAGES/DRESSINGS) ×2 IMPLANT
DRAPE LAPAROTOMY 100X72 PEDS (DRAPES) ×4 IMPLANT
DRAPE UTILITY XL STRL (DRAPES) ×4 IMPLANT
DRSG TEGADERM 4X4.75 (GAUZE/BANDAGES/DRESSINGS) IMPLANT
ELECT COATED BLADE 2.86 ST (ELECTRODE) ×3 IMPLANT
ELECT REM PT RETURN 9FT ADLT (ELECTROSURGICAL) ×4
ELECTRODE REM PT RTRN 9FT ADLT (ELECTROSURGICAL) ×2 IMPLANT
GAUZE PACKING IODOFORM 1/4X15 (GAUZE/BANDAGES/DRESSINGS) IMPLANT
GAUZE SPONGE 4X4 12PLY STRL LF (GAUZE/BANDAGES/DRESSINGS) IMPLANT
GLOVE BIO SURGEON STRL SZ 6 (GLOVE) ×4 IMPLANT
GLOVE BIOGEL PI IND STRL 6.5 (GLOVE) ×2 IMPLANT
GLOVE BIOGEL PI INDICATOR 6.5 (GLOVE) ×2
GOWN STRL REUS W/ TWL LRG LVL3 (GOWN DISPOSABLE) ×4 IMPLANT
GOWN STRL REUS W/TWL LRG LVL3 (GOWN DISPOSABLE) ×8
NDL HYPO 25X1 1.5 SAFETY (NEEDLE) ×1 IMPLANT
NEEDLE HYPO 25X1 1.5 SAFETY (NEEDLE) ×4 IMPLANT
NS IRRIG 1000ML POUR BTL (IV SOLUTION) IMPLANT
PACK BASIN DAY SURGERY FS (CUSTOM PROCEDURE TRAY) ×4 IMPLANT
PENCIL BUTTON HOLSTER BLD 10FT (ELECTRODE) ×4 IMPLANT
SLEEVE SCD COMPRESS KNEE MED (MISCELLANEOUS) ×4 IMPLANT
SUT ETHILON 2 0 FS 18 (SUTURE) IMPLANT
SUT MNCRL AB 4-0 PS2 18 (SUTURE) ×4 IMPLANT
SUT SILK 2 0 SH (SUTURE) IMPLANT
SUT VIC AB 2-0 SH 27 (SUTURE)
SUT VIC AB 2-0 SH 27XBRD (SUTURE) IMPLANT
SUT VIC AB 3-0 SH 27 (SUTURE) ×4
SUT VIC AB 3-0 SH 27X BRD (SUTURE) ×1 IMPLANT
SUT VICRYL 3-0 CR8 SH (SUTURE) IMPLANT
SUT VICRYL 4-0 PS2 18IN ABS (SUTURE) IMPLANT
SWAB COLLECTION DEVICE MRSA (MISCELLANEOUS) IMPLANT
SWAB CULTURE ESWAB REG 1ML (MISCELLANEOUS) IMPLANT
SYR CONTROL 10ML LL (SYRINGE) ×4 IMPLANT
TOWEL GREEN STERILE FF (TOWEL DISPOSABLE) ×4 IMPLANT
TOWEL OR NON WOVEN STRL DISP B (DISPOSABLE) ×4 IMPLANT
TUBE CONNECTING 20'X1/4 (TUBING)
TUBE CONNECTING 20X1/4 (TUBING) IMPLANT
UNDERPAD 30X30 (UNDERPADS AND DIAPERS) IMPLANT
YANKAUER SUCT BULB TIP NO VENT (SUCTIONS) IMPLANT

## 2017-12-21 NOTE — Interval H&P Note (Signed)
History and Physical Interval Note:  12/21/2017 8:11 AM  Katelyn Carter  has presented today for surgery, with the diagnosis of SEBACEOUS CYST  The various methods of treatment have been discussed with the patient and family. After consideration of risks, benefits and other options for treatment, the patient has consented to  Procedure(s): EXCISION OF SEBACEOUS CYST - BACK (N/A) as a surgical intervention .  The patient's history has been reviewed, patient examined, no change in status, stable for surgery.  I have reviewed the patient's chart and labs.  Questions were answered to the patient's satisfaction.     Allure Greaser Lollie Sails

## 2017-12-21 NOTE — Anesthesia Preprocedure Evaluation (Signed)
Anesthesia Evaluation  Patient identified by MRN, date of birth, ID band Patient awake    Reviewed: Allergy & Precautions, NPO status , Patient's Chart, lab work & pertinent test results  Airway Mallampati: II  TM Distance: >3 FB Neck ROM: Full    Dental  (+) Teeth Intact, Dental Advisory Given   Pulmonary sleep apnea ,    Pulmonary exam normal breath sounds clear to auscultation       Cardiovascular negative cardio ROS Normal cardiovascular exam Rhythm:Regular Rate:Normal     Neuro/Psych negative neurological ROS     GI/Hepatic negative GI ROS, Neg liver ROS,   Endo/Other  negative endocrine ROS  Renal/GU negative Renal ROS     Musculoskeletal negative musculoskeletal ROS (+)   Abdominal   Peds  Hematology negative hematology ROS (+)   Anesthesia Other Findings Day of surgery medications reviewed with the patient.  Reproductive/Obstetrics                             Anesthesia Physical Anesthesia Plan  ASA: II  Anesthesia Plan: MAC   Post-op Pain Management:    Induction: Intravenous  PONV Risk Score and Plan: 2  Airway Management Planned: Natural Airway and Simple Face Mask  Additional Equipment:   Intra-op Plan:   Post-operative Plan:   Informed Consent: I have reviewed the patients History and Physical, chart, labs and discussed the procedure including the risks, benefits and alternatives for the proposed anesthesia with the patient or authorized representative who has indicated his/her understanding and acceptance.   Dental advisory given  Plan Discussed with: CRNA and Anesthesiologist  Anesthesia Plan Comments:         Anesthesia Quick Evaluation

## 2017-12-21 NOTE — Anesthesia Procedure Notes (Signed)
Procedure Name: MAC Date/Time: 12/21/2017 8:41 AM Performed by: Marrianne Mood, CRNA Pre-anesthesia Checklist: Patient identified, Timeout performed, Emergency Drugs available, Suction available and Patient being monitored Patient Re-evaluated:Patient Re-evaluated prior to induction Oxygen Delivery Method: Simple face mask and Circle system utilized

## 2017-12-21 NOTE — Anesthesia Postprocedure Evaluation (Signed)
Anesthesia Post Note  Patient: Katelyn Carter  Procedure(s) Performed: EXCISION OF SEBACEOUS CYST - BACK (Left Back)     Patient location during evaluation: PACU Anesthesia Type: MAC Level of consciousness: awake and alert, oriented and awake Pain management: pain level controlled Vital Signs Assessment: post-procedure vital signs reviewed and stable Respiratory status: spontaneous breathing, nonlabored ventilation and respiratory function stable Cardiovascular status: stable and blood pressure returned to baseline Postop Assessment: no apparent nausea or vomiting Anesthetic complications: no    Last Vitals:  Vitals:   12/21/17 0945 12/21/17 1012  BP: 110/80 121/81  Pulse: 65 71  Resp: 15 18  Temp:  36.4 C  SpO2: 97% 100%    Last Pain:  Vitals:   12/21/17 1012  TempSrc:   PainSc: 0-No pain                 Catalina Gravel

## 2017-12-21 NOTE — Op Note (Signed)
Operative Note  VARSHA KNOCK  [id number]  [id number]  12/21/2017   Surgeon: Vikki Ports A ConnorMD  Assistant: none  Procedure performed: excision of 2cm subcutaneous cyst, left upper back  Preop diagnosis: sebaceous cyst Post-op diagnosis/intraop findings: same  Specimens: sebaceous cyst Retained items: no EBL: minimal cc Complications: none  Description of procedure: After obtaining informed consent the patient was taken to the operating room and placed prone on operating room table [facility] was initiated, preoperative antibiotics were administered and a formal timeout was performed. The skin on the upper back was prepped and draped in the usual sterile fashion. After infiltration with local a transverse elliptical incision was made over the cyst and cautery was then used to dissect the soft tissues from the cyst wall. The entire cyst wall was excised, there was some extrusion of curd-like material from the inferior aspect. Hemostasis was ensured with cautery. The incision was closed with interrupted deep dermal 3-0 vicryls, running subcutaneous 4-0 monocryl and dermabond. The patient was then returned to the supine position, awakened and taken to PACU in stable condition.   All counts were correct at the completion of the case.

## 2017-12-21 NOTE — Transfer of Care (Signed)
Immediate Anesthesia Transfer of Care Note  Patient: Katelyn Carter  Procedure(s) Performed: EXCISION OF SEBACEOUS CYST - BACK (Left Back)  Patient Location: PACU  Anesthesia Type:MAC  Level of Consciousness: awake and patient cooperative  Airway & Oxygen Therapy: Patient Spontanous Breathing  Post-op Assessment: Report given to RN and Post -op Vital signs reviewed and stable  Post vital signs: Reviewed and stable  Last Vitals:  Vitals Value Taken Time  BP    Temp    Pulse 78 12/21/2017  9:10 AM  Resp    SpO2 99 % 12/21/2017  9:10 AM  Vitals shown include unvalidated device data.  Last Pain:  Vitals:   12/21/17 0814  TempSrc: Oral  PainSc: 0-No pain         Complications: No apparent anesthesia complications

## 2017-12-21 NOTE — Discharge Instructions (Signed)
GENERAL SURGERY: POST OP INSTRUCTIONS  ######################################################################  EAT Gradually transition to a high fiber diet with a fiber supplement over the next few weeks after discharge.  Start with a pureed / full liquid diet (see below)  WALK Walk an hour a day.  Control your pain to do that.    CONTROL PAIN Control pain so that you can walk, sleep, tolerate sneezing/coughing, go up/down stairs.  HAVE A BOWEL MOVEMENT DAILY Keep your bowels regular to avoid problems.  OK to try a laxative to override constipation.  OK to use an antidairrheal to slow down diarrhea.  Call if not better after 2 tries  CALL IF YOU HAVE PROBLEMS/CONCERNS Call if you are still struggling despite following these instructions. Call if you have concerns not answered by these instructions  ######################################################################    1. DIET: Follow a light bland diet the first 24 hours after arrival home, such as soup, liquids, crackers, etc.  Be sure to include lots of fluids daily.  Avoid fast food or heavy meals as your are more likely to get nauseated.   2. Take your usually prescribed home medications unless otherwise directed. 3. PAIN CONTROL: a. Pain is best controlled by a usual combination of three different methods TOGETHER: i. Ice/Heat ii. Over the counter pain medication iii. Prescription pain medication b. Most patients will experience some swelling and bruising around the incisions.  Ice packs or heating pads (30-60 minutes up to 6 times a day) will help. Use ice for the first few days to help decrease swelling and bruising, then switch to heat to help relax tight/sore spots and speed recovery.  Some people prefer to use ice alone, heat alone, alternating between ice & heat.  Experiment to what works for you.  Swelling and bruising can take several weeks to resolve.   c. It is helpful to take an over-the-counter pain medication  regularly for the first few weeks.  Choose one of the following that works best for you: i. Naproxen (Aleve, etc)  Two 220mg  tabs twice a day ii. Ibuprofen (Advil, etc) Three 200mg  tabs four times a day (every meal & bedtime) iii. Acetaminophen (Tylenol, etc) 500-650mg  four times a day (every meal & bedtime) d. A  prescription for pain medication (such as oxycodone, hydrocodone, etc) should be given to you upon discharge.  Take your pain medication as prescribed.  i. If you are having problems/concerns with the prescription medicine (does not control pain, nausea, vomiting, rash, itching, etc), please call us [phone] to see if we need to switch you to a different pain medicine that will work better for you and/or control your side effect better. ii. If you need a refill on your pain medication, please contact your pharmacy.  They will contact our office to request authorization. Prescriptions will not be filled after 5 pm or on week-ends. 4. Avoid getting constipated.  Between the surgery and the pain medications, it is common to experience some constipation.  Increasing fluid intake and taking a fiber supplement (such as Metamucil, Citrucel, FiberCon, MiraLax, etc) 1-2 times a day regularly will usually help prevent this problem from occurring.  A mild laxative (prune juice, Milk of Magnesia, MiraLax, etc) should be taken according to package directions if there are no bowel movements after 48 hours.   5. Wash / shower every day.  You may shower over the skin glue which is water proof.  Continue to shower over incision(s) after the dressing is off. 6. Skin glue will  flake off after 2-3 weeks.  You may leave the incision open to air.  You may have skin tapes (Steri Strips) covering the incision(s).  Leave them on until one week, then remove.  You may replace a dressing/Band-Aid to cover the incision for comfort if you wish.      7. ACTIVITIES as tolerated:   a. You may resume regular (light)  daily activities beginning the next day--such as daily self-care, walking, climbing stairs--gradually increasing activities as tolerated.  If you can walk 30 minutes without difficulty, it is safe to try more intense activity such as jogging, treadmill, bicycling, low-impact aerobics, swimming, etc. b. Save the most intensive and strenuous activity for last such as sit-ups, heavy lifting, contact sports, etc  Refrain from any heavy lifting or straining until you are off narcotics for pain control.   c. DO NOT PUSH THROUGH PAIN.  Let pain be your guide: If it hurts to do something, don't do it.  Pain is your body warning you to avoid that activity for another week until the pain goes down. d. You may drive when you are no longer taking prescription pain medication, you can comfortably wear a seatbelt, and you can safely maneuver your car and apply brakes. e. Bonita Quin may have sexual intercourse when it is comfortable.  8. FOLLOW UP in our office a. Please call [facility] at [phone] to set up an appointment to see your surgeon in the office for a follow-up appointment approximately 2-3 weeks after your surgery. b. Make sure that you call for this appointment the day you arrive home to insure a convenient appointment time. 9. IF YOU HAVE DISABILITY OR FAMILY LEAVE FORMS, BRING THEM TO THE OFFICE FOR PROCESSING.  DO NOT GIVE THEM TO YOUR DOCTOR.   WHEN TO CALL us [phone]: 1. Poor pain control 2. Reactions / problems with new medications (rash/itching, nausea, etc)  3. Fever over 101.5 F (38.5 C) 4. Worsening swelling or bruising 5. Continued bleeding from incision. 6. Increased pain, redness, or drainage from the incision 7. Difficulty breathing / swallowing   The clinic staff is available to answer your questions during regular business hours (8:30am-5pm).  Please dont hesitate to call and ask to speak to one of our nurses for clinical concerns.   If you have a medical emergency, go to the  nearest emergency room or call 911.  A surgeon from [facility] is always on call at the [facility], [place] [address], [place], [place]  [address] ? MAIN: [phone] ? TOLL FREE: [phone] ?  FAX [phone] [email or web address]      Post Anesthesia Home Care Instructions  Activity: Get plenty of rest for the remainder of the day. A responsible individual must stay with you for 24 hours following the procedure.  For the next 24 hours, DO NOT: -Drive a car -Advertising copywriter -Drink alcoholic beverages -Take any medication unless instructed by your physician -Make any legal decisions or sign important papers.  Meals: Start with liquid foods such as gelatin or soup. Progress to regular foods as tolerated. Avoid greasy, spicy, heavy foods. If nausea and/or vomiting occur, drink only clear liquids until the nausea and/or vomiting subsides. Call your physician if vomiting continues.  Special Instructions/Symptoms: Your throat may feel dry or sore from the anesthesia or the breathing tube placed in your throat during surgery. If this causes discomfort, gargle with warm salt water. The discomfort should disappear  within 24 hours.  If you had a scopolamine patch placed behind your ear for the management of post- operative nausea and/or vomiting:  1. The medication in the patch is effective for 72 hours, after which it should be removed.  Wrap patch in a tissue and discard in the trash. Wash hands thoroughly with soap and water. 2. You may remove the patch earlier than 72 hours if you experience unpleasant side effects which may include dry mouth, dizziness or visual disturbances. 3. Avoid touching the patch. Wash your hands with soap and water after contact with the patch.

## 2017-12-22 ENCOUNTER — Encounter (HOSPITAL_BASED_OUTPATIENT_CLINIC_OR_DEPARTMENT_OTHER): Payer: Self-pay | Admitting: Surgery

## 2018-02-02 DIAGNOSIS — H25043 Posterior subcapsular polar age-related cataract, bilateral: Secondary | ICD-10-CM | POA: Diagnosis not present

## 2018-02-02 DIAGNOSIS — H2513 Age-related nuclear cataract, bilateral: Secondary | ICD-10-CM | POA: Diagnosis not present

## 2018-02-02 DIAGNOSIS — H16223 Keratoconjunctivitis sicca, not specified as Sjogren's, bilateral: Secondary | ICD-10-CM | POA: Diagnosis not present

## 2018-02-02 DIAGNOSIS — H25013 Cortical age-related cataract, bilateral: Secondary | ICD-10-CM | POA: Diagnosis not present

## 2018-02-02 DIAGNOSIS — H16221 Keratoconjunctivitis sicca, not specified as Sjogren's, right eye: Secondary | ICD-10-CM | POA: Diagnosis not present

## 2018-02-02 DIAGNOSIS — H16222 Keratoconjunctivitis sicca, not specified as Sjogren's, left eye: Secondary | ICD-10-CM | POA: Diagnosis not present

## 2018-03-02 HISTORY — PX: OTHER SURGICAL HISTORY: SHX169

## 2018-03-08 DIAGNOSIS — H25811 Combined forms of age-related cataract, right eye: Secondary | ICD-10-CM | POA: Diagnosis not present

## 2018-03-08 DIAGNOSIS — H2511 Age-related nuclear cataract, right eye: Secondary | ICD-10-CM | POA: Diagnosis not present

## 2018-03-09 DIAGNOSIS — H2512 Age-related nuclear cataract, left eye: Secondary | ICD-10-CM | POA: Diagnosis not present

## 2018-03-15 DIAGNOSIS — H2512 Age-related nuclear cataract, left eye: Secondary | ICD-10-CM | POA: Diagnosis not present

## 2018-03-15 DIAGNOSIS — H25812 Combined forms of age-related cataract, left eye: Secondary | ICD-10-CM | POA: Diagnosis not present

## 2018-08-11 DIAGNOSIS — H40013 Open angle with borderline findings, low risk, bilateral: Secondary | ICD-10-CM | POA: Diagnosis not present

## 2018-11-28 DIAGNOSIS — Z6830 Body mass index (BMI) 30.0-30.9, adult: Secondary | ICD-10-CM | POA: Diagnosis not present

## 2018-11-28 DIAGNOSIS — Z0000 Encounter for general adult medical examination without abnormal findings: Secondary | ICD-10-CM | POA: Diagnosis not present

## 2018-11-28 DIAGNOSIS — Z01419 Encounter for gynecological examination (general) (routine) without abnormal findings: Secondary | ICD-10-CM | POA: Diagnosis not present

## 2018-12-05 DIAGNOSIS — Z1231 Encounter for screening mammogram for malignant neoplasm of breast: Secondary | ICD-10-CM | POA: Diagnosis not present

## 2019-03-01 ENCOUNTER — Ambulatory Visit: Payer: BC Managed Care – PPO | Attending: Internal Medicine

## 2019-03-01 DIAGNOSIS — Z20822 Contact with and (suspected) exposure to covid-19: Secondary | ICD-10-CM

## 2019-03-01 DIAGNOSIS — Z20828 Contact with and (suspected) exposure to other viral communicable diseases: Secondary | ICD-10-CM | POA: Diagnosis not present

## 2019-03-02 LAB — NOVEL CORONAVIRUS, NAA: SARS-CoV-2, NAA: DETECTED — AB

## 2019-03-23 DIAGNOSIS — L309 Dermatitis, unspecified: Secondary | ICD-10-CM | POA: Diagnosis not present

## 2019-04-04 DIAGNOSIS — H40013 Open angle with borderline findings, low risk, bilateral: Secondary | ICD-10-CM | POA: Diagnosis not present

## 2019-04-11 DIAGNOSIS — H9319 Tinnitus, unspecified ear: Secondary | ICD-10-CM | POA: Diagnosis not present

## 2019-04-11 DIAGNOSIS — R251 Tremor, unspecified: Secondary | ICD-10-CM | POA: Diagnosis not present

## 2019-04-11 DIAGNOSIS — Z79890 Hormone replacement therapy (postmenopausal): Secondary | ICD-10-CM | POA: Diagnosis not present

## 2019-04-11 DIAGNOSIS — G4700 Insomnia, unspecified: Secondary | ICD-10-CM | POA: Diagnosis not present

## 2019-05-22 DIAGNOSIS — Z0184 Encounter for antibody response examination: Secondary | ICD-10-CM | POA: Diagnosis not present

## 2019-05-22 DIAGNOSIS — Z0000 Encounter for general adult medical examination without abnormal findings: Secondary | ICD-10-CM | POA: Diagnosis not present

## 2019-05-23 DIAGNOSIS — Z20828 Contact with and (suspected) exposure to other viral communicable diseases: Secondary | ICD-10-CM | POA: Diagnosis not present

## 2019-05-25 DIAGNOSIS — H9319 Tinnitus, unspecified ear: Secondary | ICD-10-CM | POA: Diagnosis not present

## 2019-05-25 DIAGNOSIS — Z1211 Encounter for screening for malignant neoplasm of colon: Secondary | ICD-10-CM | POA: Diagnosis not present

## 2019-05-25 DIAGNOSIS — B009 Herpesviral infection, unspecified: Secondary | ICD-10-CM | POA: Diagnosis not present

## 2019-05-25 DIAGNOSIS — Z6829 Body mass index (BMI) 29.0-29.9, adult: Secondary | ICD-10-CM | POA: Diagnosis not present

## 2019-05-25 DIAGNOSIS — H9313 Tinnitus, bilateral: Secondary | ICD-10-CM | POA: Diagnosis not present

## 2019-05-25 DIAGNOSIS — Z0000 Encounter for general adult medical examination without abnormal findings: Secondary | ICD-10-CM | POA: Diagnosis not present

## 2019-06-05 DIAGNOSIS — H00026 Hordeolum internum left eye, unspecified eyelid: Secondary | ICD-10-CM | POA: Diagnosis not present

## 2019-06-05 DIAGNOSIS — Z79890 Hormone replacement therapy (postmenopausal): Secondary | ICD-10-CM | POA: Diagnosis not present

## 2019-06-05 DIAGNOSIS — B009 Herpesviral infection, unspecified: Secondary | ICD-10-CM | POA: Diagnosis not present

## 2019-07-26 DIAGNOSIS — Z23 Encounter for immunization: Secondary | ICD-10-CM | POA: Diagnosis not present

## 2019-09-07 DIAGNOSIS — Z23 Encounter for immunization: Secondary | ICD-10-CM | POA: Diagnosis not present

## 2019-12-12 DIAGNOSIS — G4700 Insomnia, unspecified: Secondary | ICD-10-CM | POA: Diagnosis not present

## 2019-12-12 DIAGNOSIS — J309 Allergic rhinitis, unspecified: Secondary | ICD-10-CM | POA: Diagnosis not present

## 2019-12-12 DIAGNOSIS — J3081 Allergic rhinitis due to animal (cat) (dog) hair and dander: Secondary | ICD-10-CM | POA: Diagnosis not present

## 2019-12-12 DIAGNOSIS — H9319 Tinnitus, unspecified ear: Secondary | ICD-10-CM | POA: Diagnosis not present

## 2019-12-15 DIAGNOSIS — Z0000 Encounter for general adult medical examination without abnormal findings: Secondary | ICD-10-CM | POA: Diagnosis not present

## 2019-12-15 DIAGNOSIS — Z1231 Encounter for screening mammogram for malignant neoplasm of breast: Secondary | ICD-10-CM | POA: Diagnosis not present

## 2019-12-15 DIAGNOSIS — Z01419 Encounter for gynecological examination (general) (routine) without abnormal findings: Secondary | ICD-10-CM | POA: Diagnosis not present

## 2019-12-15 DIAGNOSIS — Z6829 Body mass index (BMI) 29.0-29.9, adult: Secondary | ICD-10-CM | POA: Diagnosis not present

## 2019-12-18 DIAGNOSIS — Z23 Encounter for immunization: Secondary | ICD-10-CM | POA: Diagnosis not present

## 2019-12-20 DIAGNOSIS — J3081 Allergic rhinitis due to animal (cat) (dog) hair and dander: Secondary | ICD-10-CM | POA: Diagnosis not present

## 2020-02-06 DIAGNOSIS — Z23 Encounter for immunization: Secondary | ICD-10-CM | POA: Diagnosis not present

## 2020-04-04 DIAGNOSIS — H40013 Open angle with borderline findings, low risk, bilateral: Secondary | ICD-10-CM | POA: Diagnosis not present

## 2020-05-27 DIAGNOSIS — Z0000 Encounter for general adult medical examination without abnormal findings: Secondary | ICD-10-CM | POA: Diagnosis not present

## 2020-05-29 DIAGNOSIS — Z0000 Encounter for general adult medical examination without abnormal findings: Secondary | ICD-10-CM | POA: Diagnosis not present

## 2020-05-29 DIAGNOSIS — J309 Allergic rhinitis, unspecified: Secondary | ICD-10-CM | POA: Diagnosis not present

## 2020-05-29 DIAGNOSIS — Z79890 Hormone replacement therapy (postmenopausal): Secondary | ICD-10-CM | POA: Diagnosis not present

## 2020-05-29 DIAGNOSIS — Z23 Encounter for immunization: Secondary | ICD-10-CM | POA: Diagnosis not present

## 2020-05-29 DIAGNOSIS — Z1211 Encounter for screening for malignant neoplasm of colon: Secondary | ICD-10-CM | POA: Diagnosis not present

## 2020-05-29 DIAGNOSIS — G4700 Insomnia, unspecified: Secondary | ICD-10-CM | POA: Diagnosis not present

## 2020-06-11 ENCOUNTER — Other Ambulatory Visit: Payer: Self-pay | Admitting: Family Medicine

## 2020-06-11 DIAGNOSIS — Z1231 Encounter for screening mammogram for malignant neoplasm of breast: Secondary | ICD-10-CM

## 2020-07-02 DIAGNOSIS — J329 Chronic sinusitis, unspecified: Secondary | ICD-10-CM | POA: Diagnosis not present

## 2020-07-02 DIAGNOSIS — J069 Acute upper respiratory infection, unspecified: Secondary | ICD-10-CM | POA: Diagnosis not present

## 2020-07-25 DIAGNOSIS — Z8379 Family history of other diseases of the digestive system: Secondary | ICD-10-CM | POA: Diagnosis not present

## 2020-07-25 DIAGNOSIS — Z1211 Encounter for screening for malignant neoplasm of colon: Secondary | ICD-10-CM | POA: Diagnosis not present

## 2020-08-29 DIAGNOSIS — N3000 Acute cystitis without hematuria: Secondary | ICD-10-CM | POA: Diagnosis not present

## 2020-09-05 DIAGNOSIS — B373 Candidiasis of vulva and vagina: Secondary | ICD-10-CM | POA: Diagnosis not present

## 2020-09-05 DIAGNOSIS — N390 Urinary tract infection, site not specified: Secondary | ICD-10-CM | POA: Diagnosis not present

## 2020-09-05 DIAGNOSIS — N3000 Acute cystitis without hematuria: Secondary | ICD-10-CM | POA: Diagnosis not present

## 2020-09-18 DIAGNOSIS — Z1211 Encounter for screening for malignant neoplasm of colon: Secondary | ICD-10-CM | POA: Diagnosis not present

## 2020-09-18 DIAGNOSIS — K6389 Other specified diseases of intestine: Secondary | ICD-10-CM | POA: Diagnosis not present

## 2020-09-18 DIAGNOSIS — K5730 Diverticulosis of large intestine without perforation or abscess without bleeding: Secondary | ICD-10-CM | POA: Diagnosis not present

## 2020-09-30 DIAGNOSIS — J309 Allergic rhinitis, unspecified: Secondary | ICD-10-CM | POA: Diagnosis not present

## 2020-09-30 DIAGNOSIS — M19011 Primary osteoarthritis, right shoulder: Secondary | ICD-10-CM | POA: Diagnosis not present

## 2020-09-30 DIAGNOSIS — G4700 Insomnia, unspecified: Secondary | ICD-10-CM | POA: Diagnosis not present

## 2020-10-08 ENCOUNTER — Ambulatory Visit
Admission: RE | Admit: 2020-10-08 | Discharge: 2020-10-08 | Disposition: A | Discharge status: Home / Self Care | Payer: BC Managed Care – PPO | Source: Ambulatory Visit | Attending: Family Medicine | Admitting: Family Medicine

## 2020-10-08 ENCOUNTER — Other Ambulatory Visit: Payer: Self-pay | Admitting: Family Medicine

## 2020-10-08 DIAGNOSIS — M25511 Pain in right shoulder: Secondary | ICD-10-CM | POA: Diagnosis not present

## 2020-10-08 DIAGNOSIS — G8929 Other chronic pain: Secondary | ICD-10-CM | POA: Diagnosis not present

## 2020-11-11 DIAGNOSIS — M19011 Primary osteoarthritis, right shoulder: Secondary | ICD-10-CM | POA: Diagnosis not present

## 2020-11-11 DIAGNOSIS — Z7185 Encounter for immunization safety counseling: Secondary | ICD-10-CM | POA: Diagnosis not present

## 2020-11-11 DIAGNOSIS — G4700 Insomnia, unspecified: Secondary | ICD-10-CM | POA: Diagnosis not present

## 2020-11-13 DIAGNOSIS — Z23 Encounter for immunization: Secondary | ICD-10-CM | POA: Diagnosis not present

## 2020-12-25 DIAGNOSIS — Z0000 Encounter for general adult medical examination without abnormal findings: Secondary | ICD-10-CM | POA: Diagnosis not present

## 2020-12-25 DIAGNOSIS — Z1231 Encounter for screening mammogram for malignant neoplasm of breast: Secondary | ICD-10-CM | POA: Diagnosis not present

## 2020-12-25 DIAGNOSIS — Z01419 Encounter for gynecological examination (general) (routine) without abnormal findings: Secondary | ICD-10-CM | POA: Diagnosis not present

## 2021-02-12 DIAGNOSIS — Z23 Encounter for immunization: Secondary | ICD-10-CM | POA: Diagnosis not present

## 2021-02-26 DIAGNOSIS — H00019 Hordeolum externum unspecified eye, unspecified eyelid: Secondary | ICD-10-CM | POA: Diagnosis not present

## 2021-04-04 DIAGNOSIS — H40013 Open angle with borderline findings, low risk, bilateral: Secondary | ICD-10-CM | POA: Diagnosis not present

## 2021-04-22 DIAGNOSIS — N3000 Acute cystitis without hematuria: Secondary | ICD-10-CM | POA: Diagnosis not present

## 2021-04-28 DIAGNOSIS — N3000 Acute cystitis without hematuria: Secondary | ICD-10-CM | POA: Diagnosis not present

## 2021-05-08 DIAGNOSIS — N3000 Acute cystitis without hematuria: Secondary | ICD-10-CM | POA: Diagnosis not present

## 2021-05-08 DIAGNOSIS — N952 Postmenopausal atrophic vaginitis: Secondary | ICD-10-CM | POA: Diagnosis not present

## 2021-05-08 DIAGNOSIS — N390 Urinary tract infection, site not specified: Secondary | ICD-10-CM | POA: Diagnosis not present

## 2021-05-08 DIAGNOSIS — R350 Frequency of micturition: Secondary | ICD-10-CM | POA: Diagnosis not present

## 2021-05-27 DIAGNOSIS — Z114 Encounter for screening for human immunodeficiency virus [HIV]: Secondary | ICD-10-CM | POA: Diagnosis not present

## 2021-05-27 DIAGNOSIS — Z0000 Encounter for general adult medical examination without abnormal findings: Secondary | ICD-10-CM | POA: Diagnosis not present

## 2021-06-03 DIAGNOSIS — Z1211 Encounter for screening for malignant neoplasm of colon: Secondary | ICD-10-CM | POA: Diagnosis not present

## 2021-06-03 DIAGNOSIS — Z0000 Encounter for general adult medical examination without abnormal findings: Secondary | ICD-10-CM | POA: Diagnosis not present

## 2021-06-03 DIAGNOSIS — Z23 Encounter for immunization: Secondary | ICD-10-CM | POA: Diagnosis not present

## 2021-06-03 DIAGNOSIS — N3000 Acute cystitis without hematuria: Secondary | ICD-10-CM | POA: Diagnosis not present

## 2021-06-04 DIAGNOSIS — M19011 Primary osteoarthritis, right shoulder: Secondary | ICD-10-CM | POA: Diagnosis not present

## 2021-06-04 DIAGNOSIS — J309 Allergic rhinitis, unspecified: Secondary | ICD-10-CM | POA: Diagnosis not present

## 2021-06-04 DIAGNOSIS — G4700 Insomnia, unspecified: Secondary | ICD-10-CM | POA: Diagnosis not present

## 2021-07-04 DIAGNOSIS — N3000 Acute cystitis without hematuria: Secondary | ICD-10-CM | POA: Diagnosis not present

## 2021-07-04 DIAGNOSIS — R350 Frequency of micturition: Secondary | ICD-10-CM | POA: Diagnosis not present

## 2021-07-18 DIAGNOSIS — H0011 Chalazion right upper eyelid: Secondary | ICD-10-CM | POA: Diagnosis not present

## 2021-07-21 DIAGNOSIS — N3000 Acute cystitis without hematuria: Secondary | ICD-10-CM | POA: Diagnosis not present

## 2021-07-21 DIAGNOSIS — H00013 Hordeolum externum right eye, unspecified eyelid: Secondary | ICD-10-CM | POA: Diagnosis not present

## 2021-07-21 DIAGNOSIS — N952 Postmenopausal atrophic vaginitis: Secondary | ICD-10-CM | POA: Diagnosis not present

## 2021-07-25 DIAGNOSIS — R32 Unspecified urinary incontinence: Secondary | ICD-10-CM | POA: Diagnosis not present

## 2021-07-25 DIAGNOSIS — N3000 Acute cystitis without hematuria: Secondary | ICD-10-CM | POA: Diagnosis not present

## 2021-07-25 DIAGNOSIS — N390 Urinary tract infection, site not specified: Secondary | ICD-10-CM | POA: Diagnosis not present

## 2021-09-23 DIAGNOSIS — Z87440 Personal history of urinary (tract) infections: Secondary | ICD-10-CM | POA: Diagnosis not present

## 2021-09-23 DIAGNOSIS — R319 Hematuria, unspecified: Secondary | ICD-10-CM | POA: Diagnosis not present

## 2021-09-23 DIAGNOSIS — N952 Postmenopausal atrophic vaginitis: Secondary | ICD-10-CM | POA: Diagnosis not present

## 2021-10-31 DIAGNOSIS — Z298 Encounter for other specified prophylactic measures: Secondary | ICD-10-CM | POA: Diagnosis not present

## 2021-11-26 DIAGNOSIS — Z23 Encounter for immunization: Secondary | ICD-10-CM | POA: Diagnosis not present

## 2021-12-02 IMAGING — DX DG SHOULDER 2+V*R*
3 series · 3 of 3 positions shown · non-contrast
Comparison: None.

CLINICAL DATA: Right shoulder pain. Patient reports chronic pain.
No known injury.

EXAM:
RIGHT SHOULDER - 2+ VIEW

[dg shoulder right (1 of 3)]
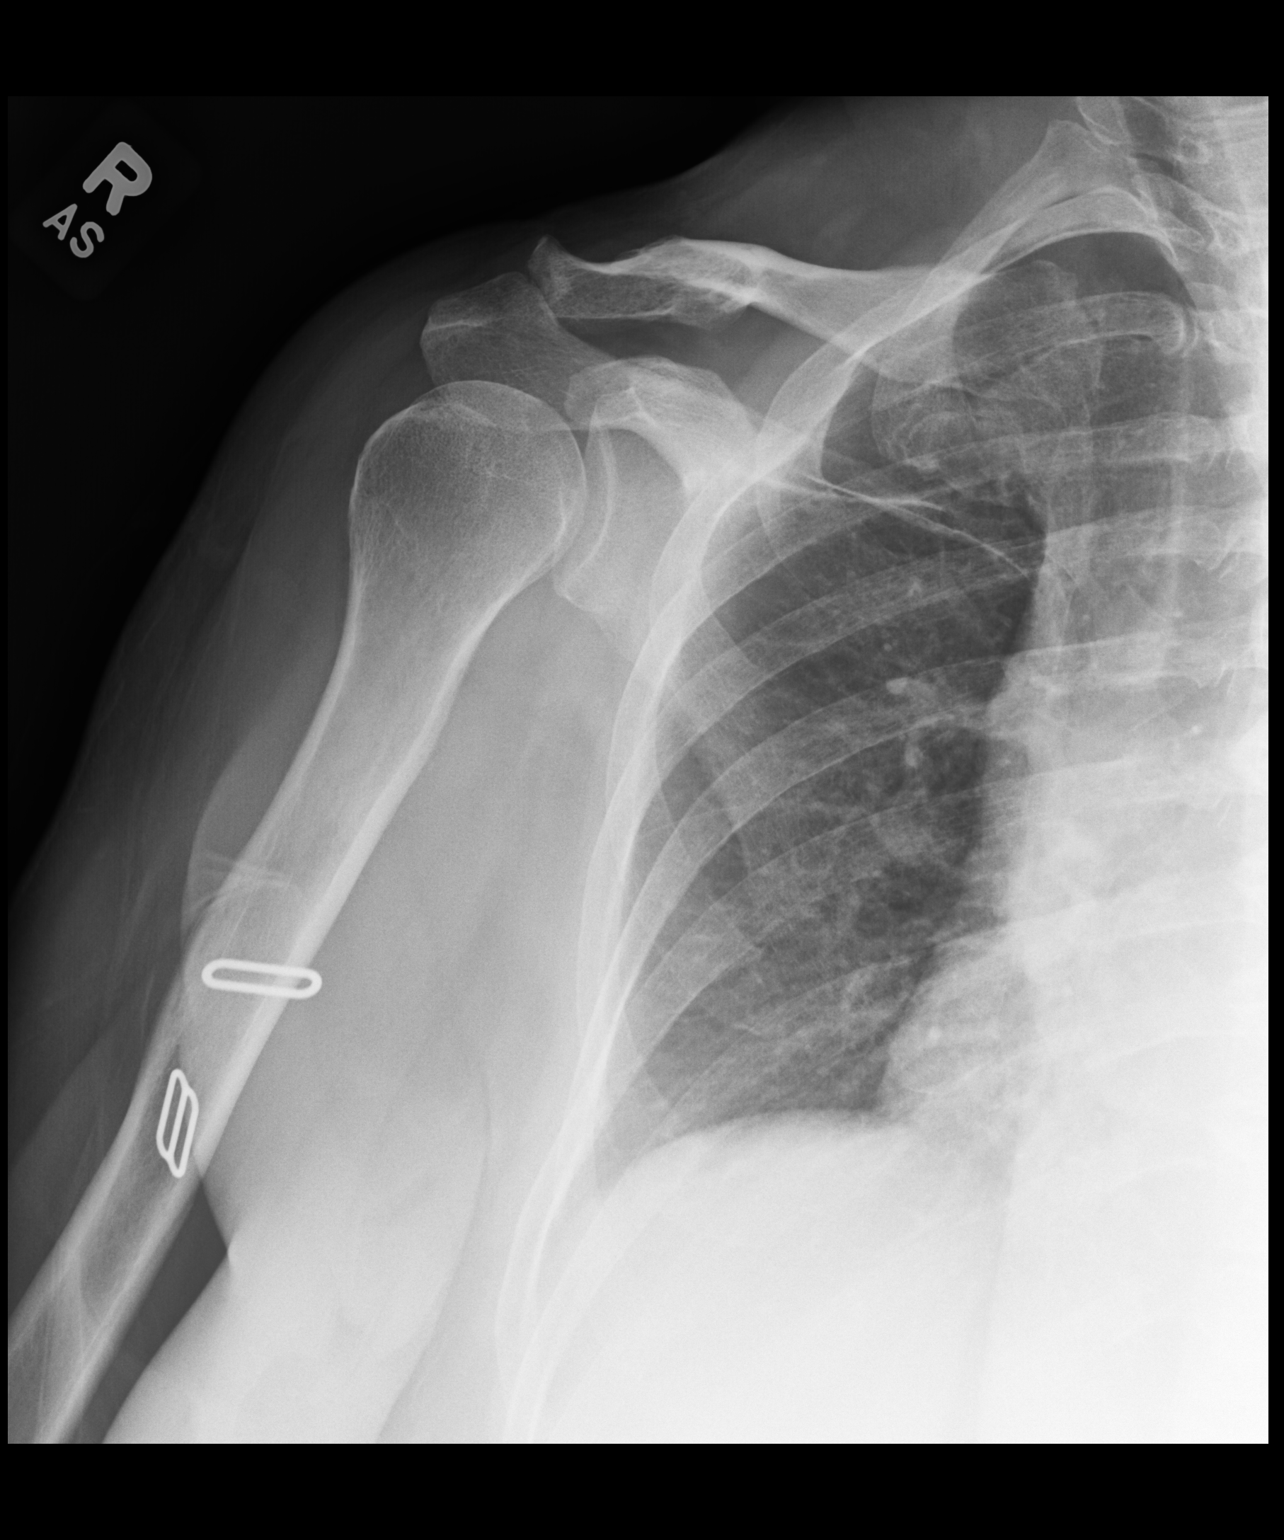

[dg shoulder right (2 of 3)]
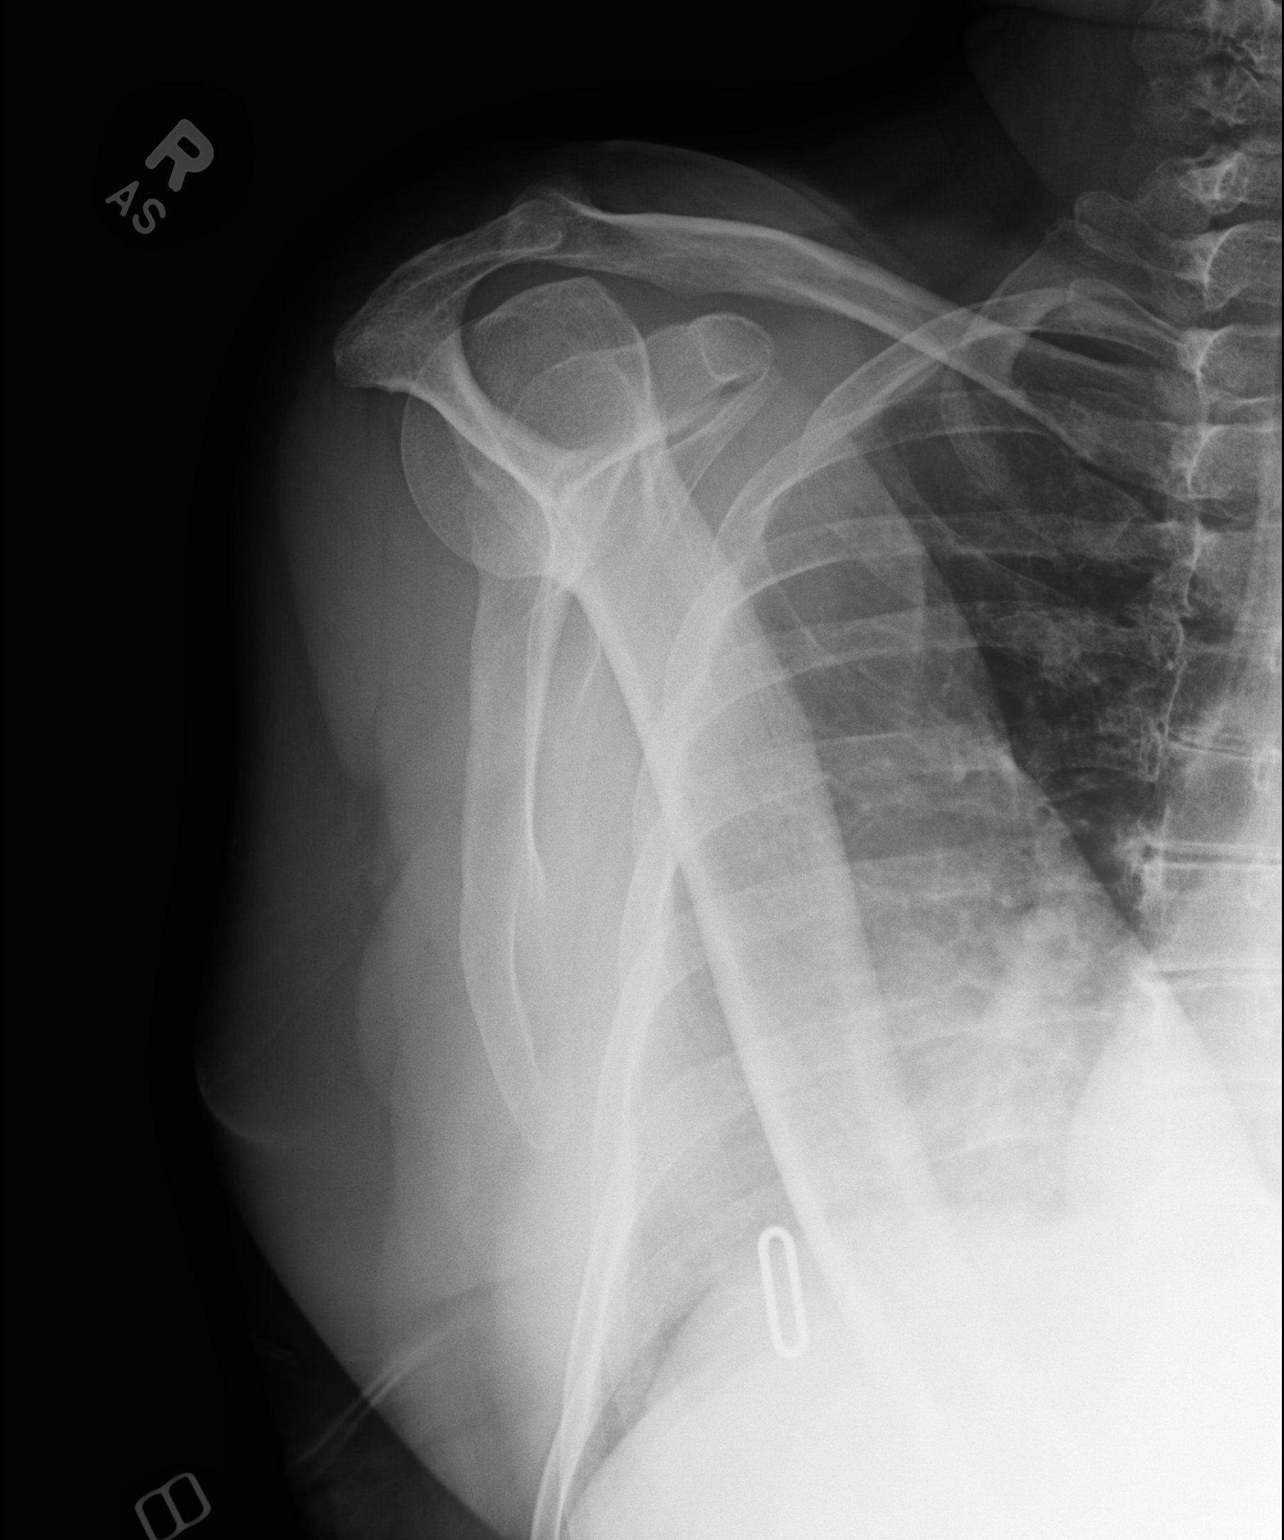

[dg shoulder right (3 of 3)]
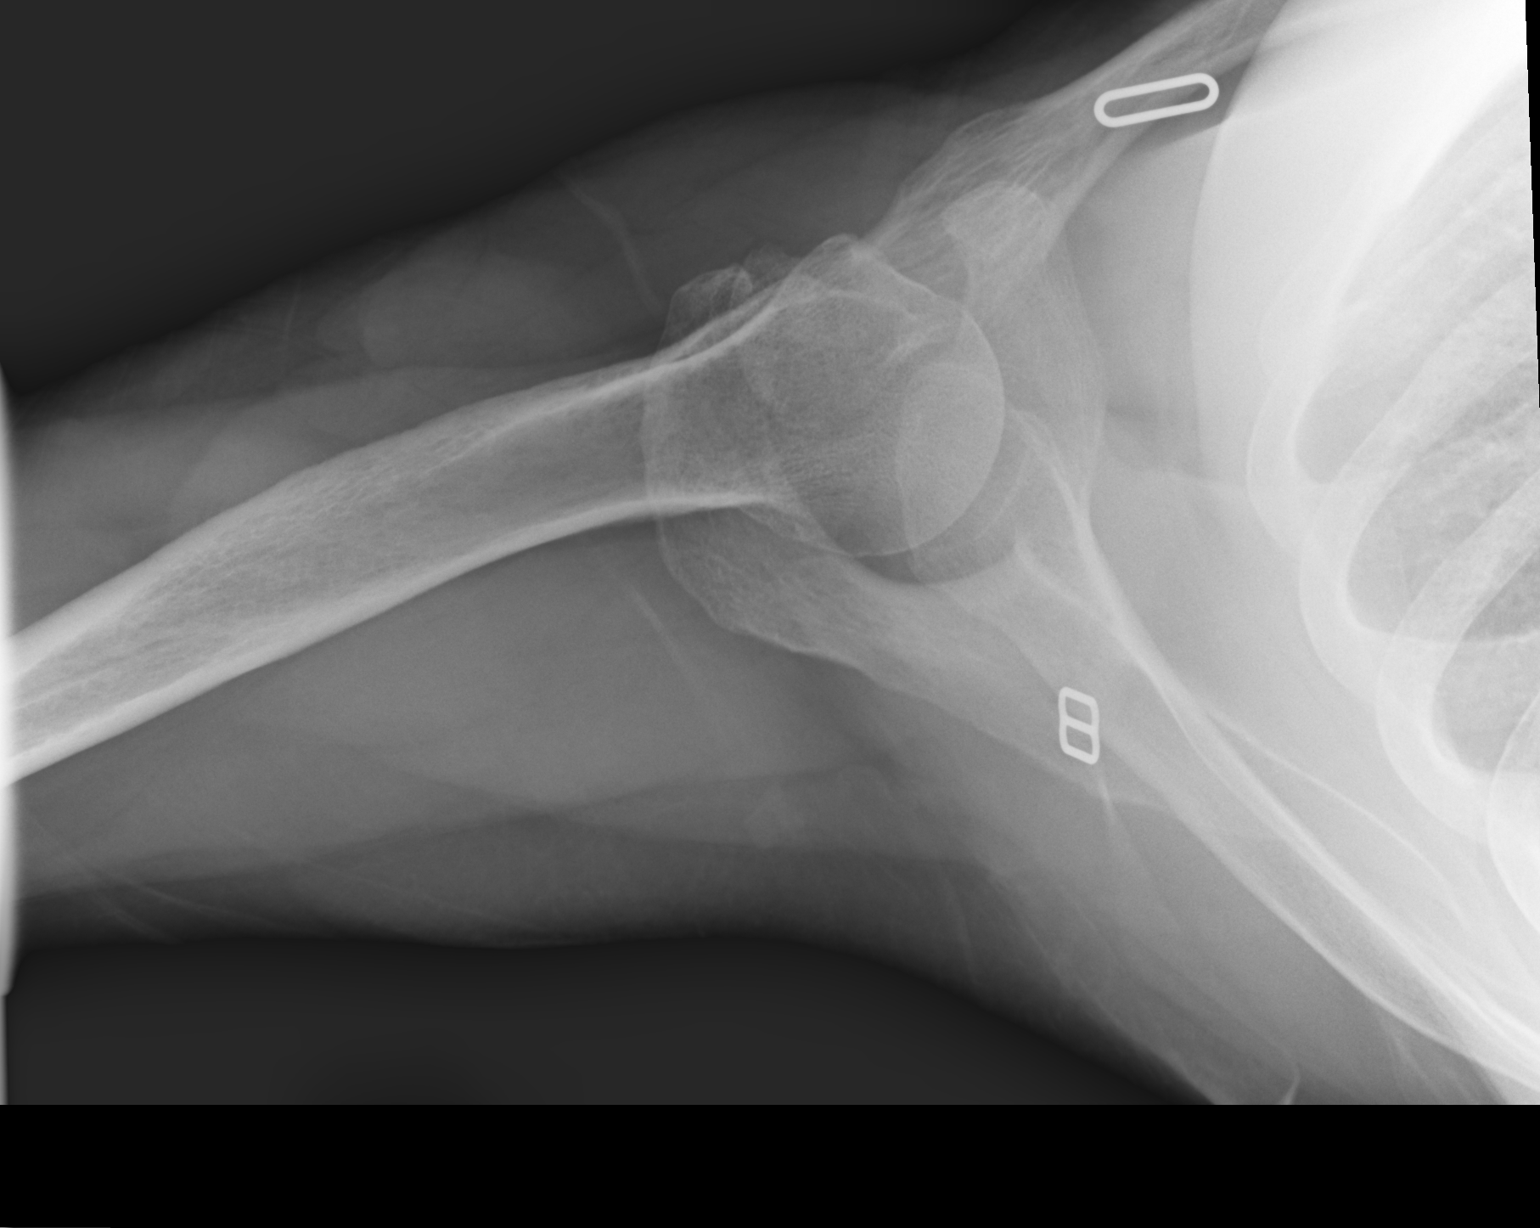

[3 of 3 positions shown; findings below may reference images not displayed]

FINDINGS: There is no evidence of fracture or dislocation. Normal joint spaces
and alignment. Minimal acromioclavicular spurring. No erosion,
avascular necrosis or focal bone abnormality. Soft tissues are
unremarkable.
IMPRESSION: Minimal acromioclavicular osteoarthritis.

## 2022-01-28 DIAGNOSIS — Z01419 Encounter for gynecological examination (general) (routine) without abnormal findings: Secondary | ICD-10-CM | POA: Diagnosis not present

## 2022-01-28 DIAGNOSIS — Z6831 Body mass index (BMI) 31.0-31.9, adult: Secondary | ICD-10-CM | POA: Diagnosis not present

## 2022-01-28 DIAGNOSIS — Z0000 Encounter for general adult medical examination without abnormal findings: Secondary | ICD-10-CM | POA: Diagnosis not present

## 2022-02-05 DIAGNOSIS — Z1231 Encounter for screening mammogram for malignant neoplasm of breast: Secondary | ICD-10-CM | POA: Diagnosis not present

## 2022-02-24 DIAGNOSIS — G4700 Insomnia, unspecified: Secondary | ICD-10-CM | POA: Diagnosis not present

## 2022-04-27 DIAGNOSIS — M19011 Primary osteoarthritis, right shoulder: Secondary | ICD-10-CM | POA: Diagnosis not present

## 2022-04-27 DIAGNOSIS — I10 Essential (primary) hypertension: Secondary | ICD-10-CM | POA: Diagnosis not present

## 2022-04-27 DIAGNOSIS — I8390 Asymptomatic varicose veins of unspecified lower extremity: Secondary | ICD-10-CM | POA: Diagnosis not present

## 2022-04-27 DIAGNOSIS — M25572 Pain in left ankle and joints of left foot: Secondary | ICD-10-CM | POA: Diagnosis not present

## 2022-04-28 ENCOUNTER — Other Ambulatory Visit: Payer: Self-pay | Admitting: Family Medicine

## 2022-04-28 ENCOUNTER — Other Ambulatory Visit: Payer: Self-pay

## 2022-04-28 DIAGNOSIS — M25572 Pain in left ankle and joints of left foot: Secondary | ICD-10-CM

## 2022-05-04 ENCOUNTER — Other Ambulatory Visit: Payer: Self-pay | Admitting: Family Medicine

## 2022-05-04 ENCOUNTER — Ambulatory Visit
Admission: RE | Admit: 2022-05-04 | Discharge: 2022-05-04 | Disposition: A | Discharge status: Home / Self Care | Payer: BC Managed Care – PPO | Source: Ambulatory Visit | Attending: Family Medicine | Admitting: Family Medicine

## 2022-05-04 DIAGNOSIS — M25572 Pain in left ankle and joints of left foot: Secondary | ICD-10-CM

## 2022-05-19 ENCOUNTER — Ambulatory Visit
Admission: RE | Admit: 2022-05-19 | Discharge: 2022-05-19 | Disposition: A | Discharge status: Home / Self Care | Payer: BC Managed Care – PPO | Source: Ambulatory Visit | Attending: Family Medicine | Admitting: Family Medicine

## 2022-05-19 DIAGNOSIS — M25572 Pain in left ankle and joints of left foot: Secondary | ICD-10-CM

## 2022-05-22 ENCOUNTER — Encounter: Payer: Self-pay | Admitting: Family Medicine

## 2022-05-26 ENCOUNTER — Other Ambulatory Visit: Payer: Self-pay | Admitting: Family Medicine

## 2022-05-26 DIAGNOSIS — I824Y2 Acute embolism and thrombosis of unspecified deep veins of left proximal lower extremity: Secondary | ICD-10-CM

## 2022-05-28 ENCOUNTER — Ambulatory Visit
Admission: RE | Admit: 2022-05-28 | Discharge: 2022-05-28 | Disposition: A | Discharge status: Home / Self Care | Payer: BC Managed Care – PPO | Source: Ambulatory Visit | Attending: Family Medicine | Admitting: Family Medicine

## 2022-05-28 DIAGNOSIS — L819 Disorder of pigmentation, unspecified: Secondary | ICD-10-CM | POA: Diagnosis not present

## 2022-05-28 DIAGNOSIS — I824Y2 Acute embolism and thrombosis of unspecified deep veins of left proximal lower extremity: Secondary | ICD-10-CM

## 2022-06-05 DIAGNOSIS — Z13220 Encounter for screening for lipoid disorders: Secondary | ICD-10-CM | POA: Diagnosis not present

## 2022-06-05 DIAGNOSIS — Z0000 Encounter for general adult medical examination without abnormal findings: Secondary | ICD-10-CM | POA: Diagnosis not present

## 2022-06-05 DIAGNOSIS — E782 Mixed hyperlipidemia: Secondary | ICD-10-CM | POA: Diagnosis not present

## 2022-06-08 DIAGNOSIS — Z0000 Encounter for general adult medical examination without abnormal findings: Secondary | ICD-10-CM | POA: Diagnosis not present

## 2022-07-01 DIAGNOSIS — H40013 Open angle with borderline findings, low risk, bilateral: Secondary | ICD-10-CM | POA: Diagnosis not present

## 2022-07-13 DIAGNOSIS — N952 Postmenopausal atrophic vaginitis: Secondary | ICD-10-CM | POA: Diagnosis not present

## 2022-07-13 DIAGNOSIS — I10 Essential (primary) hypertension: Secondary | ICD-10-CM | POA: Diagnosis not present

## 2022-07-13 DIAGNOSIS — R5383 Other fatigue: Secondary | ICD-10-CM | POA: Diagnosis not present

## 2022-07-13 DIAGNOSIS — G4700 Insomnia, unspecified: Secondary | ICD-10-CM | POA: Diagnosis not present

## 2022-09-21 DIAGNOSIS — G4700 Insomnia, unspecified: Secondary | ICD-10-CM | POA: Diagnosis not present

## 2022-09-21 DIAGNOSIS — E669 Obesity, unspecified: Secondary | ICD-10-CM | POA: Diagnosis not present

## 2022-09-21 DIAGNOSIS — J309 Allergic rhinitis, unspecified: Secondary | ICD-10-CM | POA: Diagnosis not present

## 2022-09-21 DIAGNOSIS — R5383 Other fatigue: Secondary | ICD-10-CM | POA: Diagnosis not present

## 2022-09-23 ENCOUNTER — Telehealth: Payer: Self-pay

## 2022-09-23 NOTE — Telephone Encounter (Signed)
Received referral from Dr Duanne Guess for OSA. Placed in sleep mailbox

## 2022-10-01 NOTE — Telephone Encounter (Signed)
Notes missing from referral, only demographic sheet attached. Reached out to referring office for more info

## 2022-10-21 ENCOUNTER — Other Ambulatory Visit: Payer: Self-pay | Admitting: Family Medicine

## 2022-10-21 DIAGNOSIS — Z8249 Family history of ischemic heart disease and other diseases of the circulatory system: Secondary | ICD-10-CM

## 2022-11-19 ENCOUNTER — Ambulatory Visit
Admission: RE | Admit: 2022-11-19 | Discharge: 2022-11-19 | Disposition: A | Discharge status: Home / Self Care | Payer: No Typology Code available for payment source | Source: Ambulatory Visit | Attending: Family Medicine | Admitting: Family Medicine

## 2022-11-19 DIAGNOSIS — Z8249 Family history of ischemic heart disease and other diseases of the circulatory system: Secondary | ICD-10-CM

## 2022-11-24 ENCOUNTER — Encounter: Payer: Self-pay | Admitting: *Deleted

## 2022-11-25 ENCOUNTER — Encounter: Payer: Self-pay | Admitting: Neurology

## 2022-11-25 ENCOUNTER — Ambulatory Visit (INDEPENDENT_AMBULATORY_CARE_PROVIDER_SITE_OTHER): Payer: BC Managed Care – PPO | Admitting: Neurology

## 2022-11-25 VITALS — BP 121/81 | HR 75 | Ht 61.0 in | Wt 171.0 lb

## 2022-11-25 DIAGNOSIS — R351 Nocturia: Secondary | ICD-10-CM

## 2022-11-25 DIAGNOSIS — G4700 Insomnia, unspecified: Secondary | ICD-10-CM

## 2022-11-25 DIAGNOSIS — Z820 Family history of epilepsy and other diseases of the nervous system: Secondary | ICD-10-CM | POA: Diagnosis not present

## 2022-11-25 DIAGNOSIS — E669 Obesity, unspecified: Secondary | ICD-10-CM

## 2022-11-25 DIAGNOSIS — R635 Abnormal weight gain: Secondary | ICD-10-CM

## 2022-11-25 DIAGNOSIS — E66811 Obesity, class 1: Secondary | ICD-10-CM

## 2022-11-25 DIAGNOSIS — G4733 Obstructive sleep apnea (adult) (pediatric): Secondary | ICD-10-CM | POA: Diagnosis not present

## 2022-11-25 NOTE — Progress Notes (Signed)
Subjective:    Patient ID: Katelyn Carter is a 62 y.o. female.  HPI    Huston Foley, MD, PhD [facility] [address], [address] [address] [place], [place] [address]  Dear Cala Bradford,  I saw your patient, Katelyn Carter, upon your kind request and my sleep clinic today for initial consultation of her sleep disorder, in particular, concern for underlying obstructive sleep apnea.  The patient is unaccompanied today.  As you know, [name] is a 61 year old female with an underlying medical history of hypertension, allergies, tinnitus, tremor, osteoarthritis, and mild obesity, who reports intermittent snoring and chronic difficulty with her sleep.  She has some daytime tiredness.  She has a longstanding history of difficulty initiating and maintaining sleep, has been on Ambien for many years.  She is currently on 5 mg generic Ambien nightly.  She previously also tried the long-acting generic Ambien 6.25 mg strength.  She is retired, she lives with her husband, she has a grown son.   Bedtime is generally around 11:30 PM or midnight, rise time between 5 and 5:30 AM.  She drinks caffeine in the form of coffee, 2 to 3 cups in the morning.  She drinks alcohol about 3-4 times a week, up to 2 drinks on average.  She is a non-smoker.  She is retired, she worked in Airline [employer].  They have currently no pets in the household.  She does not have a TV in her bedroom.  She had a hysterectomy in her early 59s and was on hormone replacement therapy for many years.  She is no longer on hormone therapy and has had some hot flashes at night.  Her brother has sleep apnea and has a PAP machine.  She has nocturia about twice per average night, denies recurrent nocturnal or morning headaches. She was previously diagnosed with obstructive sleep apnea but has not been on PAP therapy in the past several years.  She reports that she tried PAP therapy about 2 years.  I was able to review a CPAP titration study  report from 03/19/2008.  Study was interpreted by Dr. Armanda Magic.  Her baseline AHI reportedly was 14.7/h.  She was recommended to start AutoPap 4 to 20 cm.  She reports that she had discomfort with the PAP machine.  She had jaw surgery at 2015. Her Epworth sleepiness score is 6/24, fatigue severity score is 43/63. I reviewed your telemedicine note from 09/21/2022.  She had a little bit of weight increase since then, weight was recorded at 160 pounds at the time of her sleep study in 2010, current weight 171 pounds.  Her Past Medical History Is Significant For: Past Medical History:  Diagnosis Date   Allergic    Atrophic vaginitis    Candida vaginitis    Herpes simplex    Hordeolum internum of left eye    Hyperlipidemia    Hypertension    Insomnia    OSA (obstructive sleep apnea)    does not use CPAP now, had mouth surgery   Sebaceous cyst    on back   Tinnitus    Tremor     Her Past Surgical History Is Significant For: Past Surgical History:  Procedure Laterality Date   ABDOMINAL HYSTERECTOMY  2004   APPENDECTOMY  1977   cataract surgery  2020   MANDIBLE RECONSTRUCTION  2016   MASS EXCISION Left 12/21/2017   Procedure: EXCISION OF SEBACEOUS CYST - BACK;  Surgeon: Berna Bue, MD;  Location: [facility];  Service: General;  Laterality: Left;    Her Family History Is Significant For: Family History  Problem Relation Age of Onset   Hyperlipidemia Mother    Cancer Father        kidney   Sleep apnea Neg Hx     Her Social History Is Significant For: Social History   Socioeconomic History   Marital status: Married    Spouse name: Not on file   Number of children: Not on file   Years of education: Not on file   Highest education level: Not on file  Occupational History   Not on file  Tobacco Use   Smoking status: Never   Smokeless tobacco: Never  Substance and Sexual Activity   Alcohol use: Yes    Alcohol/week: 6.0 standard drinks of alcohol     Types: 6 Glasses of wine per week    Comment: social   Drug use: No   Sexual activity: Yes    Birth control/protection: Surgical  Other Topics Concern   Not on file  Social History Narrative   Not on file   Social Determinants of Health   Financial Resource Strain: Not on file  Food Insecurity: Not on file  Transportation Needs: Not on file  Physical Activity: Not on file  Stress: Not on file  Social Connections: Not on file    Her Allergies Are:  Allergies  Allergen Reactions   Shellfish Allergy Nausea Only   Sudafed [Pseudoephedrine Hcl]     jittery  :   Her Current Medications Are:  Outpatient Encounter Medications as of 11/25/2022  Medication Sig   azelastine (ASTELIN) 0.1 % nasal spray    BLACK COHOSH PO Take by mouth.   diphenhydramine-acetaminophen (TYLENOL PM) 25-500 MG TABS tablet Take 1 tablet by mouth at bedtime as needed.   estradiol (ESTRACE) 1 MG tablet Take 1 mg by mouth every other day.   loratadine (CLARITIN REDITABS) 10 MG dissolvable tablet Take 10 mg by mouth daily.   montelukast (SINGULAIR) 10 MG tablet Take 10 mg by mouth daily.   valACYclovir (VALTREX) 1000 MG tablet Take 4,000 mg by mouth once.   zolpidem (AMBIEN) 5 MG tablet Take 5-10 mg by mouth at bedtime as needed.   No facility-administered encounter medications on file as of 11/25/2022.  :   Review of Systems:  Out of a complete 14 point review of systems, all are reviewed and negative with the exception of these symptoms as listed below:  Review of Systems  Neurological:        Pt here for sleep   Pt  has hypertension, some snoring ,fatigue Pt  had sleep study 2010 CPAP for two years . Pt states jaw surgery 2015  Pt denies headaches       Objective:  Neurological Exam  Physical Exam Physical Examination:   Vitals:   11/25/22 0910  BP: 121/81  Pulse: 75    General Examination: The patient is a very pleasant 62 y.o. female in no acute distress. She appears well-developed and  well-nourished and well groomed.   HEENT: Normocephalic, atraumatic, pupils are equal, round and reactive to light, extraocular tracking is good without limitation to gaze excursion or nystagmus noted. Hearing is grossly intact. Face is symmetric with normal facial animation. Speech is clear with no dysarthria noted. There is no hypophonia. There is no lip, neck/head, jaw or voice tremor. Neck is supple with full range of passive and active motion. There are no  carotid bruits on auscultation. Oropharynx exam reveals: mild mouth dryness, good dental hygiene and mild airway crowding, due to small airway entry, small mouth opening noted, Mallampati class IV, smaller tonsils.  Neck circumference 14-1/4 inches.  Tongue protrudes centrally and palate elevates symmetrically.  She has a permanent retainer on the bottom and uses a retainer at night on top.  Minimal overbite noted.  Chest: Clear to auscultation without wheezing, rhonchi or crackles noted.  Heart: S1+S2+0, regular and normal without murmurs, rubs or gallops noted.   Abdomen: Soft, non-tender and non-distended.  Extremities: There is no pitting edema in the distal lower extremities bilaterally.   Skin: Warm and dry without trophic changes noted.   Musculoskeletal: exam reveals no obvious joint deformities.   Neurologically:  Mental status: The patient is awake, alert and oriented in all 4 spheres. Her immediate and remote memory, attention, language skills and fund of knowledge are appropriate. There is no evidence of aphasia, agnosia, apraxia or anomia. Speech is clear with normal prosody and enunciation. Thought process is linear. Mood is normal and affect is normal.  Cranial nerves II - XII are as described above under HEENT exam.  Motor exam: Normal bulk, strength and tone is noted. There is no obvious action or resting tremor.  Fine motor skills and coordination: grossly intact.  Cerebellar testing: No dysmetria or intention tremor.  There is no truncal or gait ataxia.  Sensory exam: intact to light touch in the upper and lower extremities.  Gait, station and balance: She stands easily. No veering to one side is noted. No leaning to one side is noted. Posture is age-appropriate and stance is narrow based. Gait shows normal stride length and normal pace. No problems turning are noted.   Assessment and Plan:  In summary, CHANDI ZUFALL is a very pleasant 62 y.o.-year old female with an underlying medical history of hypertension, allergies, tinnitus, tremor, osteoarthritis, and mild obesity, whose history and physical exam are concerning for sleep disordered breathing, particularly obstructive sleep apnea (OSA).  She was previously diagnosed with mild obstructive sleep apnea in 2010.  While a laboratory attended sleep study is typically considered "gold standard" for evaluation of sleep disordered breathing, we mutually agreed to proceed with a home sleep test at this time.   I had a long chat with the patient about my findings and the diagnosis of sleep apnea, particularly OSA, its prognosis and treatment options. We talked about medical/conservative treatments, surgical interventions and non-pharmacological approaches for symptom control. I explained, in particular, the risks and ramifications of untreated moderate to severe OSA, especially with respect to developing cardiovascular disease down the road, including congestive heart failure (CHF), difficult to treat hypertension, cardiac arrhythmias (particularly A-fib), neurovascular complications including TIA, stroke and dementia. Even type 2 diabetes has, in part, been linked to untreated OSA. Symptoms of untreated OSA may include (but may not be limited to) daytime sleepiness, nocturia (i.e. frequent nighttime urination), memory problems, mood irritability and suboptimally controlled or worsening mood disorder such as depression and/or anxiety, lack of energy, lack of motivation,  physical discomfort, as well as recurrent headaches, especially morning or nocturnal headaches. We talked about the importance of maintaining a healthy lifestyle and striving for healthy weight. In addition, we talked about the importance of striving for and maintaining good sleep hygiene.  She was encouraged to try to make enough time for sleep as well, 7 to 8 hours a night are generally recommended for the average adult. I recommended a sleep  study at this time. I outlined the differences between a laboratory attended sleep study which is considered more comprehensive and accurate over the option of a home sleep test (HST); the latter may lead to underestimation of sleep disordered breathing in some instances and does not help with diagnosing upper airway resistance syndrome and is not accurate enough to diagnose primary central sleep apnea typically. I outlined possible surgical and non-surgical treatment options of OSA, including the use of a positive airway pressure (PAP) device (i.e. CPAP, AutoPAP/APAP or BiPAP in certain circumstances), a custom-made dental device (aka oral appliance, which would require a referral to a specialist dentist or orthodontist typically, and is generally speaking not considered for patients with full dentures or edentulous state), upper airway surgical options, such as traditional UPPP (which is not considered a first-line treatment) or the Inspire device (hypoglossal nerve stimulator, which would involve a referral for consultation with an ENT surgeon, after careful selection, following inclusion criteria - also not first-line treatment). I explained the PAP treatment option to the patient in detail, as this is generally considered first-line treatment.  The patient indicated that she would be willing to try PAP therapy, if the need arises. I explained the importance of being compliant with PAP treatment, not only for insurance purposes but primarily to improve patient's  symptoms symptoms, and for the patient's long term health benefit, including to reduce Her cardiovascular risks longer-term.    We will pick up our discussion about the next steps and treatment options after testing.  We will keep her posted as to the test results by phone call and/or MyChart messaging where possible.  We will plan to follow-up in sleep clinic accordingly as well.  I answered all her questions today and the patient was in agreement.   I encouraged her to call with any interim questions, concerns, problems or updates or email [place] through MyChart.  Generally speaking, sleep test authorizations may take up to 2 weeks, sometimes less, sometimes longer, the patient is encouraged to get in touch with [place] if they do not hear back from the sleep lab staff directly within the next 2 weeks.  Thank you very much for allowing me to participate in the care of this nice patient. If I can be of any further assistance to you please do not hesitate to call me at [phone].  Sincerely,   Huston Foley, MD, PhD

## 2022-11-25 NOTE — Patient Instructions (Signed)

## 2022-12-21 DIAGNOSIS — E559 Vitamin D deficiency, unspecified: Secondary | ICD-10-CM | POA: Diagnosis not present

## 2022-12-21 DIAGNOSIS — R5383 Other fatigue: Secondary | ICD-10-CM | POA: Diagnosis not present

## 2022-12-23 DIAGNOSIS — R1032 Left lower quadrant pain: Secondary | ICD-10-CM | POA: Diagnosis not present

## 2022-12-23 DIAGNOSIS — F331 Major depressive disorder, recurrent, moderate: Secondary | ICD-10-CM | POA: Diagnosis not present

## 2022-12-23 DIAGNOSIS — F411 Generalized anxiety disorder: Secondary | ICD-10-CM | POA: Diagnosis not present

## 2022-12-23 DIAGNOSIS — G4700 Insomnia, unspecified: Secondary | ICD-10-CM | POA: Diagnosis not present

## 2022-12-24 ENCOUNTER — Telehealth: Payer: Self-pay | Admitting: Neurology

## 2022-12-24 NOTE — Telephone Encounter (Signed)
12/08/22 LVM [place] 11/26/22 [place] Numbers: [id number] (exp. 11/26/22 to 01/24/23) EE

## 2022-12-30 ENCOUNTER — Ambulatory Visit: Payer: BC Managed Care – PPO | Admitting: Neurology

## 2022-12-30 DIAGNOSIS — G4733 Obstructive sleep apnea (adult) (pediatric): Secondary | ICD-10-CM | POA: Diagnosis not present

## 2022-12-30 DIAGNOSIS — Z820 Family history of epilepsy and other diseases of the nervous system: Secondary | ICD-10-CM

## 2022-12-30 DIAGNOSIS — G4700 Insomnia, unspecified: Secondary | ICD-10-CM

## 2022-12-30 DIAGNOSIS — E66811 Obesity, class 1: Secondary | ICD-10-CM

## 2022-12-30 DIAGNOSIS — R351 Nocturia: Secondary | ICD-10-CM

## 2022-12-30 DIAGNOSIS — R635 Abnormal weight gain: Secondary | ICD-10-CM

## 2023-01-07 NOTE — Procedures (Signed)
   [facility]  HOME SLEEP TEST (Watch PAT) REPORT - Mail-out Device  STUDY DATE: 01/04/23  DOB: 10/06/1960  MRN: [id number]  ORDERING CLINICIAN: Huston Foley, MD, PhD   REFERRING CLINICIAN: Dow Adolph, NP  CLINICAL INFORMATION/HISTORY: 62 year old female with an underlying medical history of hypertension, allergies, tinnitus, tremor, osteoarthritis, and mild obesity, who reports intermittent snoring and chronic difficulty with her sleep.  She has some daytime tiredness. She was diagnosed with OSA in the past and is currently not on a PAP machine.   Epworth sleepiness score: 6/24.  BMI: 32.3 kg/m  FINDINGS:   Sleep Summary:   Total Recording Time (hours, min): 7 hours, 0 min  Total Sleep Time (hours, min):  6 hours, 15 min  Percent REM (%):    17.1%   Respiratory Indices:   Calculated pAHI (per hour):  20.8/hour         REM pAHI:    24.8/hour       NREM pAHI: 19.9/hour  Central pAHI: 1/hour  Oxygen Saturation Statistics:    Oxygen Saturation (%) Mean: 93%   Minimum oxygen saturation (%):                 87%   O2 Saturation Range (%): 87 - 99%    O2 Saturation (minutes) <=88%: 0.2 min  Pulse Rate Statistics:   Pulse Mean (bpm):    65/min    Pulse Range (41 - 92/min)   IMPRESSION: OSA (obstructive sleep apnea), moderate  RECOMMENDATION:  This home sleep test demonstrates moderate obstructive sleep apnea with a total AHI of 20.8/hour and O2 nadir of 87%. Mild to moderate snoring was detected. Treatment with a positive airway pressure (PAP) device is recommended. The patient will be advised to proceed with an autoPAP titration/trial at home for now. A full night titration study may be considered to optimize treatment settings, monitor proper oxygen saturations and aid with improvement of tolerance and adherence, if needed down the road. Alternative treatment options may include a dental device through dentistry or orthodontics in selected  patients or Inspire (hypoglossal nerve stimulator) in carefully selected patients (meeting inclusion criteria).  Concomitant weight loss is recommended (where clinically appropriate). Please note that untreated obstructive sleep apnea may carry additional perioperative morbidity. Patients with significant obstructive sleep apnea should receive perioperative PAP therapy and the surgeons and particularly the anesthesiologist should be informed of the diagnosis and the severity of the sleep disordered breathing. The patient should be cautioned not to drive, work at heights, or operate dangerous or heavy equipment when tired or sleepy. Review and reiteration of good sleep hygiene measures should be pursued with any patient. Other causes of the patient's symptoms, including circadian rhythm disturbances, an underlying mood disorder, medication effect and/or an underlying medical problem cannot be ruled out based on this test. Clinical correlation is recommended.  The patient and her referring provider will be notified of the test results. The patient will be seen in follow up in sleep clinic at [facility].  I certify that I have reviewed the raw data recording prior to the issuance of this report in accordance with the standards of the American Academy of Sleep Medicine (AASM).  INTERPRETING PHYSICIAN:   Huston Foley, MD, PhD Medical Director, [facility]) Diplomat, ABPN (Neurology and Sleep)   [facility] [address] [place], [place] [address] [phone]

## 2023-01-07 NOTE — Addendum Note (Signed)
Addended by: Huston Foley on: 01/07/2023 11:16 AM   Modules accepted: Orders

## 2023-01-07 NOTE — Telephone Encounter (Addendum)
Bobbye Morton, CMA  Zott, Kennyth Arnold; Gamble, Tammy New orders have been placed for the above pt, DOB: 04-18-2060 Thank  Zott, Stacy  Rateel Beldin, Abbe Amsterdam, CMA; Melvern Sample Got It Thank You

## 2023-01-07 NOTE — Progress Notes (Signed)
See procedure note.

## 2023-01-11 DIAGNOSIS — G4733 Obstructive sleep apnea (adult) (pediatric): Secondary | ICD-10-CM | POA: Diagnosis not present

## 2023-01-11 DIAGNOSIS — N390 Urinary tract infection, site not specified: Secondary | ICD-10-CM | POA: Diagnosis not present

## 2023-01-11 DIAGNOSIS — R109 Unspecified abdominal pain: Secondary | ICD-10-CM | POA: Diagnosis not present

## 2023-01-11 DIAGNOSIS — L309 Dermatitis, unspecified: Secondary | ICD-10-CM | POA: Diagnosis not present

## 2023-01-11 DIAGNOSIS — R1013 Epigastric pain: Secondary | ICD-10-CM | POA: Diagnosis not present

## 2023-01-26 NOTE — Telephone Encounter (Signed)
We received a fax from [facility] asking for Dr Frances Furbish to sign off on oral appliance and send notes . Per Dr Frances Furbish, recommendation was for cpap therapy to treat sleep apnea and not oral appliance as pt has moderate OSA. I called [facility] and let them know. They will reach out to the patient. Pt had told them she didn't want to wear a cpap.

## 2023-02-03 DIAGNOSIS — R109 Unspecified abdominal pain: Secondary | ICD-10-CM | POA: Diagnosis not present

## 2023-02-03 DIAGNOSIS — G4733 Obstructive sleep apnea (adult) (pediatric): Secondary | ICD-10-CM | POA: Diagnosis not present

## 2023-02-03 DIAGNOSIS — K219 Gastro-esophageal reflux disease without esophagitis: Secondary | ICD-10-CM | POA: Diagnosis not present

## 2023-02-03 DIAGNOSIS — Z23 Encounter for immunization: Secondary | ICD-10-CM | POA: Diagnosis not present

## 2023-02-03 DIAGNOSIS — M25811 Other specified joint disorders, right shoulder: Secondary | ICD-10-CM | POA: Diagnosis not present

## 2023-02-15 DIAGNOSIS — G4733 Obstructive sleep apnea (adult) (pediatric): Secondary | ICD-10-CM | POA: Diagnosis not present

## 2023-02-17 DIAGNOSIS — Z01419 Encounter for gynecological examination (general) (routine) without abnormal findings: Secondary | ICD-10-CM | POA: Diagnosis not present

## 2023-02-17 DIAGNOSIS — Z1231 Encounter for screening mammogram for malignant neoplasm of breast: Secondary | ICD-10-CM | POA: Diagnosis not present

## 2023-03-18 DIAGNOSIS — G4733 Obstructive sleep apnea (adult) (pediatric): Secondary | ICD-10-CM | POA: Diagnosis not present

## 2023-03-24 DIAGNOSIS — R5383 Other fatigue: Secondary | ICD-10-CM | POA: Diagnosis not present

## 2023-03-24 DIAGNOSIS — E559 Vitamin D deficiency, unspecified: Secondary | ICD-10-CM | POA: Diagnosis not present

## 2023-03-29 DIAGNOSIS — E559 Vitamin D deficiency, unspecified: Secondary | ICD-10-CM | POA: Diagnosis not present

## 2023-03-29 DIAGNOSIS — F331 Major depressive disorder, recurrent, moderate: Secondary | ICD-10-CM | POA: Diagnosis not present

## 2023-03-29 DIAGNOSIS — G4700 Insomnia, unspecified: Secondary | ICD-10-CM | POA: Diagnosis not present

## 2023-03-29 DIAGNOSIS — F411 Generalized anxiety disorder: Secondary | ICD-10-CM | POA: Diagnosis not present

## 2023-04-08 DIAGNOSIS — M25511 Pain in right shoulder: Secondary | ICD-10-CM | POA: Diagnosis not present

## 2023-04-18 DIAGNOSIS — G4733 Obstructive sleep apnea (adult) (pediatric): Secondary | ICD-10-CM | POA: Diagnosis not present

## 2023-04-26 DIAGNOSIS — M25811 Other specified joint disorders, right shoulder: Secondary | ICD-10-CM | POA: Diagnosis not present

## 2023-04-26 DIAGNOSIS — F331 Major depressive disorder, recurrent, moderate: Secondary | ICD-10-CM | POA: Diagnosis not present

## 2023-04-26 DIAGNOSIS — G4700 Insomnia, unspecified: Secondary | ICD-10-CM | POA: Diagnosis not present

## 2023-04-26 DIAGNOSIS — F411 Generalized anxiety disorder: Secondary | ICD-10-CM | POA: Diagnosis not present

## 2023-04-28 DIAGNOSIS — M25511 Pain in right shoulder: Secondary | ICD-10-CM | POA: Diagnosis not present

## 2023-05-03 DIAGNOSIS — M25511 Pain in right shoulder: Secondary | ICD-10-CM | POA: Diagnosis not present

## 2023-05-06 DIAGNOSIS — M25511 Pain in right shoulder: Secondary | ICD-10-CM | POA: Diagnosis not present

## 2023-05-14 DIAGNOSIS — M25511 Pain in right shoulder: Secondary | ICD-10-CM | POA: Diagnosis not present

## 2023-05-16 DIAGNOSIS — G4733 Obstructive sleep apnea (adult) (pediatric): Secondary | ICD-10-CM | POA: Diagnosis not present

## 2023-05-18 DIAGNOSIS — M25511 Pain in right shoulder: Secondary | ICD-10-CM | POA: Diagnosis not present

## 2023-05-20 DIAGNOSIS — M25511 Pain in right shoulder: Secondary | ICD-10-CM | POA: Diagnosis not present

## 2023-05-24 DIAGNOSIS — M25511 Pain in right shoulder: Secondary | ICD-10-CM | POA: Diagnosis not present

## 2023-05-26 DIAGNOSIS — G4733 Obstructive sleep apnea (adult) (pediatric): Secondary | ICD-10-CM | POA: Diagnosis not present

## 2023-05-26 DIAGNOSIS — I10 Essential (primary) hypertension: Secondary | ICD-10-CM | POA: Diagnosis not present

## 2023-05-27 DIAGNOSIS — M25511 Pain in right shoulder: Secondary | ICD-10-CM | POA: Diagnosis not present

## 2023-06-07 DIAGNOSIS — Z13220 Encounter for screening for lipoid disorders: Secondary | ICD-10-CM | POA: Diagnosis not present

## 2023-06-07 DIAGNOSIS — Z0000 Encounter for general adult medical examination without abnormal findings: Secondary | ICD-10-CM | POA: Diagnosis not present

## 2023-06-10 DIAGNOSIS — H6500 Acute serous otitis media, unspecified ear: Secondary | ICD-10-CM | POA: Diagnosis not present

## 2023-06-10 DIAGNOSIS — Z0000 Encounter for general adult medical examination without abnormal findings: Secondary | ICD-10-CM | POA: Diagnosis not present

## 2023-06-10 DIAGNOSIS — Z23 Encounter for immunization: Secondary | ICD-10-CM | POA: Diagnosis not present

## 2023-06-10 DIAGNOSIS — M25511 Pain in right shoulder: Secondary | ICD-10-CM | POA: Diagnosis not present

## 2023-06-30 DIAGNOSIS — G4700 Insomnia, unspecified: Secondary | ICD-10-CM | POA: Diagnosis not present

## 2023-06-30 DIAGNOSIS — J309 Allergic rhinitis, unspecified: Secondary | ICD-10-CM | POA: Diagnosis not present

## 2023-06-30 DIAGNOSIS — K219 Gastro-esophageal reflux disease without esophagitis: Secondary | ICD-10-CM | POA: Diagnosis not present

## 2023-06-30 DIAGNOSIS — F411 Generalized anxiety disorder: Secondary | ICD-10-CM | POA: Diagnosis not present

## 2023-07-04 DIAGNOSIS — M25511 Pain in right shoulder: Secondary | ICD-10-CM | POA: Diagnosis not present

## 2023-07-12 DIAGNOSIS — M25511 Pain in right shoulder: Secondary | ICD-10-CM | POA: Diagnosis not present

## 2023-07-14 DIAGNOSIS — M75121 Complete rotator cuff tear or rupture of right shoulder, not specified as traumatic: Secondary | ICD-10-CM | POA: Diagnosis not present

## 2023-07-30 DIAGNOSIS — K219 Gastro-esophageal reflux disease without esophagitis: Secondary | ICD-10-CM | POA: Diagnosis not present

## 2023-07-30 DIAGNOSIS — M25511 Pain in right shoulder: Secondary | ICD-10-CM | POA: Diagnosis not present

## 2023-07-30 DIAGNOSIS — B001 Herpesviral vesicular dermatitis: Secondary | ICD-10-CM | POA: Diagnosis not present

## 2023-08-16 DIAGNOSIS — M75121 Complete rotator cuff tear or rupture of right shoulder, not specified as traumatic: Secondary | ICD-10-CM | POA: Diagnosis not present

## 2023-08-16 DIAGNOSIS — M24111 Other articular cartilage disorders, right shoulder: Secondary | ICD-10-CM | POA: Diagnosis not present

## 2023-08-16 DIAGNOSIS — M7521 Bicipital tendinitis, right shoulder: Secondary | ICD-10-CM | POA: Diagnosis not present

## 2023-08-16 DIAGNOSIS — M7551 Bursitis of right shoulder: Secondary | ICD-10-CM | POA: Diagnosis not present

## 2023-08-16 DIAGNOSIS — S43431A Superior glenoid labrum lesion of right shoulder, initial encounter: Secondary | ICD-10-CM | POA: Diagnosis not present

## 2023-08-16 DIAGNOSIS — M7541 Impingement syndrome of right shoulder: Secondary | ICD-10-CM | POA: Diagnosis not present

## 2023-08-16 DIAGNOSIS — M7581 Other shoulder lesions, right shoulder: Secondary | ICD-10-CM | POA: Diagnosis not present

## 2023-08-16 DIAGNOSIS — G8918 Other acute postprocedural pain: Secondary | ICD-10-CM | POA: Diagnosis not present

## 2023-08-23 DIAGNOSIS — G8918 Other acute postprocedural pain: Secondary | ICD-10-CM | POA: Diagnosis not present

## 2023-08-23 DIAGNOSIS — M25511 Pain in right shoulder: Secondary | ICD-10-CM | POA: Diagnosis not present

## 2023-08-25 DIAGNOSIS — M25511 Pain in right shoulder: Secondary | ICD-10-CM | POA: Diagnosis not present

## 2023-08-25 DIAGNOSIS — G8918 Other acute postprocedural pain: Secondary | ICD-10-CM | POA: Diagnosis not present

## 2023-08-26 DIAGNOSIS — G4733 Obstructive sleep apnea (adult) (pediatric): Secondary | ICD-10-CM | POA: Diagnosis not present

## 2023-08-26 DIAGNOSIS — I10 Essential (primary) hypertension: Secondary | ICD-10-CM | POA: Diagnosis not present

## 2023-08-31 DIAGNOSIS — G8918 Other acute postprocedural pain: Secondary | ICD-10-CM | POA: Diagnosis not present

## 2023-08-31 DIAGNOSIS — M25511 Pain in right shoulder: Secondary | ICD-10-CM | POA: Diagnosis not present

## 2023-09-02 DIAGNOSIS — M25511 Pain in right shoulder: Secondary | ICD-10-CM | POA: Diagnosis not present

## 2023-09-02 DIAGNOSIS — G8918 Other acute postprocedural pain: Secondary | ICD-10-CM | POA: Diagnosis not present

## 2023-09-07 DIAGNOSIS — E559 Vitamin D deficiency, unspecified: Secondary | ICD-10-CM | POA: Diagnosis not present

## 2023-09-07 DIAGNOSIS — G8918 Other acute postprocedural pain: Secondary | ICD-10-CM | POA: Diagnosis not present

## 2023-09-07 DIAGNOSIS — M25511 Pain in right shoulder: Secondary | ICD-10-CM | POA: Diagnosis not present

## 2023-09-07 DIAGNOSIS — R5383 Other fatigue: Secondary | ICD-10-CM | POA: Diagnosis not present

## 2023-09-09 DIAGNOSIS — M25511 Pain in right shoulder: Secondary | ICD-10-CM | POA: Diagnosis not present

## 2023-09-09 DIAGNOSIS — G8918 Other acute postprocedural pain: Secondary | ICD-10-CM | POA: Diagnosis not present

## 2023-09-13 DIAGNOSIS — E559 Vitamin D deficiency, unspecified: Secondary | ICD-10-CM | POA: Diagnosis not present

## 2023-09-13 DIAGNOSIS — Z7184 Encounter for health counseling related to travel: Secondary | ICD-10-CM | POA: Diagnosis not present

## 2023-09-13 DIAGNOSIS — G8918 Other acute postprocedural pain: Secondary | ICD-10-CM | POA: Diagnosis not present

## 2023-09-13 DIAGNOSIS — M25511 Pain in right shoulder: Secondary | ICD-10-CM | POA: Diagnosis not present

## 2023-09-16 DIAGNOSIS — M25511 Pain in right shoulder: Secondary | ICD-10-CM | POA: Diagnosis not present

## 2023-09-16 DIAGNOSIS — G8918 Other acute postprocedural pain: Secondary | ICD-10-CM | POA: Diagnosis not present

## 2023-09-21 DIAGNOSIS — M25511 Pain in right shoulder: Secondary | ICD-10-CM | POA: Diagnosis not present

## 2023-09-21 DIAGNOSIS — G8918 Other acute postprocedural pain: Secondary | ICD-10-CM | POA: Diagnosis not present

## 2023-09-23 DIAGNOSIS — G8918 Other acute postprocedural pain: Secondary | ICD-10-CM | POA: Diagnosis not present

## 2023-09-23 DIAGNOSIS — M25511 Pain in right shoulder: Secondary | ICD-10-CM | POA: Diagnosis not present

## 2023-10-06 DIAGNOSIS — G8918 Other acute postprocedural pain: Secondary | ICD-10-CM | POA: Diagnosis not present

## 2023-10-06 DIAGNOSIS — M25511 Pain in right shoulder: Secondary | ICD-10-CM | POA: Diagnosis not present

## 2023-10-11 DIAGNOSIS — G8918 Other acute postprocedural pain: Secondary | ICD-10-CM | POA: Diagnosis not present

## 2023-10-11 DIAGNOSIS — M25511 Pain in right shoulder: Secondary | ICD-10-CM | POA: Diagnosis not present

## 2023-10-13 DIAGNOSIS — M25511 Pain in right shoulder: Secondary | ICD-10-CM | POA: Diagnosis not present

## 2023-10-13 DIAGNOSIS — G8918 Other acute postprocedural pain: Secondary | ICD-10-CM | POA: Diagnosis not present

## 2023-11-05 DIAGNOSIS — G8918 Other acute postprocedural pain: Secondary | ICD-10-CM | POA: Diagnosis not present

## 2023-11-05 DIAGNOSIS — M25511 Pain in right shoulder: Secondary | ICD-10-CM | POA: Diagnosis not present

## 2023-11-08 DIAGNOSIS — G4733 Obstructive sleep apnea (adult) (pediatric): Secondary | ICD-10-CM | POA: Diagnosis not present

## 2023-11-08 DIAGNOSIS — G4700 Insomnia, unspecified: Secondary | ICD-10-CM | POA: Diagnosis not present

## 2023-11-08 DIAGNOSIS — Z7185 Encounter for immunization safety counseling: Secondary | ICD-10-CM | POA: Diagnosis not present

## 2023-11-08 DIAGNOSIS — T7840XA Allergy, unspecified, initial encounter: Secondary | ICD-10-CM | POA: Diagnosis not present

## 2023-11-15 DIAGNOSIS — M25511 Pain in right shoulder: Secondary | ICD-10-CM | POA: Diagnosis not present

## 2023-11-15 DIAGNOSIS — G8918 Other acute postprocedural pain: Secondary | ICD-10-CM | POA: Diagnosis not present

## 2023-11-16 DIAGNOSIS — Z4789 Encounter for other orthopedic aftercare: Secondary | ICD-10-CM | POA: Diagnosis not present

## 2023-11-19 DIAGNOSIS — M25511 Pain in right shoulder: Secondary | ICD-10-CM | POA: Diagnosis not present

## 2023-11-19 DIAGNOSIS — G8918 Other acute postprocedural pain: Secondary | ICD-10-CM | POA: Diagnosis not present

## 2023-11-25 DIAGNOSIS — M25511 Pain in right shoulder: Secondary | ICD-10-CM | POA: Diagnosis not present

## 2023-11-25 DIAGNOSIS — G8918 Other acute postprocedural pain: Secondary | ICD-10-CM | POA: Diagnosis not present

## 2023-11-29 DIAGNOSIS — M25511 Pain in right shoulder: Secondary | ICD-10-CM | POA: Diagnosis not present

## 2023-11-29 DIAGNOSIS — G8918 Other acute postprocedural pain: Secondary | ICD-10-CM | POA: Diagnosis not present

## 2023-12-01 DIAGNOSIS — M25511 Pain in right shoulder: Secondary | ICD-10-CM | POA: Diagnosis not present

## 2023-12-01 DIAGNOSIS — G8918 Other acute postprocedural pain: Secondary | ICD-10-CM | POA: Diagnosis not present

## 2023-12-14 DIAGNOSIS — M25511 Pain in right shoulder: Secondary | ICD-10-CM | POA: Diagnosis not present

## 2023-12-14 DIAGNOSIS — G8918 Other acute postprocedural pain: Secondary | ICD-10-CM | POA: Diagnosis not present

## 2023-12-23 DIAGNOSIS — G8918 Other acute postprocedural pain: Secondary | ICD-10-CM | POA: Diagnosis not present

## 2023-12-23 DIAGNOSIS — M25511 Pain in right shoulder: Secondary | ICD-10-CM | POA: Diagnosis not present

## 2024-01-04 DIAGNOSIS — M25511 Pain in right shoulder: Secondary | ICD-10-CM | POA: Diagnosis not present

## 2024-01-04 DIAGNOSIS — G8918 Other acute postprocedural pain: Secondary | ICD-10-CM | POA: Diagnosis not present

## 2024-01-04 DIAGNOSIS — E559 Vitamin D deficiency, unspecified: Secondary | ICD-10-CM | POA: Diagnosis not present

## 2024-01-04 DIAGNOSIS — Z9889 Other specified postprocedural states: Secondary | ICD-10-CM | POA: Diagnosis not present

## 2024-01-04 DIAGNOSIS — R5383 Other fatigue: Secondary | ICD-10-CM | POA: Diagnosis not present

## 2024-01-07 DIAGNOSIS — G8918 Other acute postprocedural pain: Secondary | ICD-10-CM | POA: Diagnosis not present

## 2024-01-07 DIAGNOSIS — M25511 Pain in right shoulder: Secondary | ICD-10-CM | POA: Diagnosis not present

## 2024-01-13 DIAGNOSIS — M25511 Pain in right shoulder: Secondary | ICD-10-CM | POA: Diagnosis not present

## 2024-01-13 DIAGNOSIS — G8918 Other acute postprocedural pain: Secondary | ICD-10-CM | POA: Diagnosis not present

## 2024-01-17 DIAGNOSIS — M25511 Pain in right shoulder: Secondary | ICD-10-CM | POA: Diagnosis not present

## 2024-01-17 DIAGNOSIS — G8918 Other acute postprocedural pain: Secondary | ICD-10-CM | POA: Diagnosis not present

## 2024-01-20 DIAGNOSIS — G8918 Other acute postprocedural pain: Secondary | ICD-10-CM | POA: Diagnosis not present

## 2024-01-20 DIAGNOSIS — M25511 Pain in right shoulder: Secondary | ICD-10-CM | POA: Diagnosis not present

## 2024-01-25 DIAGNOSIS — M25511 Pain in right shoulder: Secondary | ICD-10-CM | POA: Diagnosis not present

## 2024-01-25 DIAGNOSIS — G8918 Other acute postprocedural pain: Secondary | ICD-10-CM | POA: Diagnosis not present

## 2024-01-31 DIAGNOSIS — M25511 Pain in right shoulder: Secondary | ICD-10-CM | POA: Diagnosis not present

## 2024-01-31 DIAGNOSIS — G8918 Other acute postprocedural pain: Secondary | ICD-10-CM | POA: Diagnosis not present

## 2024-03-13 NOTE — Progress Notes (Unsigned)
 " [facility] [address] [place]. [place] [address] [phone]       OFFICE FOLLOW UP NOTE  [name] Date of Birth:  11-May-1960 Medical Record Number:  [id number]    Primary neurologist: Dr. Buck Reason for visit: CPAP follow-up    SUBJECTIVE:   CHIEF COMPLAINT:  No chief complaint on file.   Follow-up visit:  Prior visit: 11/25/2022 with Dr. Buck  Brief HPI:   Katelyn Carter is a 64 y.o. female who was evaluated by Dr. Buck in 11/2022 for concern of underlying sleep apnea with complaints of intermittent snoring, chronic insomnia and some daytime fatigue.  Reports prior diagnosis of sleep apnea in 2010 but difficulty tolerating CPAP.  ESS 6/24.  HST 12/2022 showed moderate OSA with total AHI of 20.8/h and O2 nadir of 87%.  Recommended initiation of AutoPap therapy.  Patient initially agreed to initiate CPAP therapy and order placed to DME although received request from dentistry at end of November requesting Dr. Buck to sign off on receiving oral appliance as patient declined CPAP therapy.  As she was noted to have moderate apnea, Dr. Buck did not agree with oral appliance and encouraged CPAP therapy. She was ultimately set up with CPAP 01/2023 but never had return visit.       Interval history:        ROS:   14 system review of systems performed and negative with exception of those listed in HPI  PMH:  Past Medical History:  Diagnosis Date   Allergic    Atrophic vaginitis    Candida vaginitis    Herpes simplex    Hordeolum internum of left eye    Hyperlipidemia    Hypertension    Insomnia    OSA (obstructive sleep apnea)    does not use CPAP now, had mouth surgery   Sebaceous cyst    on back   Tinnitus    Tremor     PSH:  Past Surgical History:  Procedure Laterality Date   ABDOMINAL HYSTERECTOMY  2004   APPENDECTOMY  1977   cataract surgery  2020   MANDIBLE RECONSTRUCTION  2016   MASS EXCISION Left 12/21/2017    Procedure: EXCISION OF SEBACEOUS CYST - BACK;  Surgeon: Signe Mitzie LABOR, MD;  Location: [facility];  Service: General;  Laterality: Left;    Social History:  Social History   Socioeconomic History   Marital status: Married    Spouse name: Not on file   Number of children: Not on file   Years of education: Not on file   Highest education level: Not on file  Occupational History   Not on file  Tobacco Use   Smoking status: Never   Smokeless tobacco: Never  Substance and Sexual Activity   Alcohol use: Yes    Alcohol/week: 6.0 standard drinks of alcohol    Types: 6 Glasses of wine per week    Comment: social   Drug use: No   Sexual activity: Yes    Birth control/protection: Surgical  Other Topics Concern   Not on file  Social History Narrative   Not on file   Social Drivers of Health   Tobacco Use: Low Risk (11/25/2022)   Patient History    Smoking Tobacco Use: Never    Smokeless Tobacco Use: Never    Passive Exposure: Not on file  Financial Resource Strain: Not on file  Food Insecurity: Not on file  Transportation Needs: Not on  file  Physical Activity: Not on file  Stress: Not on file  Social Connections: Not on file  Intimate Partner Violence: Not on file  Depression (EYV7-0): Not on file  Alcohol Screen: Not on file  Housing: Not on file  Utilities: Not on file  Health Literacy: Not on file    Family History:  Family History  Problem Relation Age of Onset   Hyperlipidemia Mother    Cancer Father        kidney   Sleep apnea Neg Hx     Medications:  Medications Ordered Prior to Encounter[1]  Allergies:  Allergies[2]    OBJECTIVE:  Physical Exam  There were no vitals filed for this visit. There is no height or weight on file to calculate BMI. No results found.   General: well developed, well nourished, seated, in no evident distress Head: head normocephalic and atraumatic.   Neck: supple with no carotid or supraclavicular  bruits Cardiovascular: regular rate and rhythm, no murmurs  Neurologic Exam Mental Status: Awake and fully alert. Oriented to place and time. Recent and remote memory intact. Attention span, concentration and fund of knowledge appropriate. Mood and affect appropriate.  Cranial Nerves: Pupils equal, briskly reactive to light. Extraocular movements full without nystagmus. Visual fields full to confrontation. Hearing intact. Facial sensation intact. Face, tongue, palate moves normally and symmetrically.  Motor: Normal bulk and tone. Normal strength in all tested extremity muscles Gait and Station: Arises from chair without difficulty. Stance is normal. Gait demonstrates normal stride length and balance without use of AD.         ASSESSMENT/PLAN: Katelyn Carter is a 64 y.o. year old female    OSA on CPAP :  Compliance report shows suboptimal usage *** Continue current pressure settings 6-11 with EPR 3 Discussed continued nightly usage with ensuring greater than 4 hours nightly for optimal benefit and per insurance purposes.   Continue to follow with DME company for any needed supplies or CPAP related concerns CPAP set up 01/2023     Follow up in *** or call earlier if needed   CC:  PCP: Claudene Pellet, MD    I personally spent a total of *** minutes in the care of the patient today including {Time Based Coding:210964241}.     Harlene Bogaert, AGNP-BC  [facility] [address] [place], [place] [address]  Phone [phone] Fax [phone] Note: This document was prepared with digital dictation and possible smart phrase technology. Any transcriptional errors that result from this process are unintentional.         [1]  Current Outpatient Medications on File Prior to Visit  Medication Sig Dispense Refill   azelastine (ASTELIN) 0.1 % nasal spray      BLACK COHOSH PO Take by mouth.     diphenhydramine-acetaminophen  (TYLENOL  PM)  25-500 MG TABS tablet Take 1 tablet by mouth at bedtime as needed.     estradiol (ESTRACE) 1 MG tablet Take 1 mg by mouth every other day.     loratadine (CLARITIN REDITABS) 10 MG dissolvable tablet Take 10 mg by mouth daily.     montelukast (SINGULAIR) 10 MG tablet Take 10 mg by mouth daily.     valACYclovir (VALTREX) 1000 MG tablet Take 4,000 mg by mouth once.     zolpidem (AMBIEN) 5 MG tablet Take 5-10 mg by mouth at bedtime as needed.     No current facility-administered medications on file prior to visit.  [2]  Allergies Allergen Reactions  Shellfish Allergy Nausea Only   Sudafed [Pseudoephedrine Hcl]     jittery   "

## 2024-03-13 NOTE — Progress Notes (Unsigned)
 SABRA

## 2024-03-14 ENCOUNTER — Ambulatory Visit: Admitting: Adult Health

## 2024-03-14 ENCOUNTER — Encounter: Payer: Self-pay | Admitting: Adult Health

## 2024-03-14 VITALS — BP 100/58 | HR 58 | Ht 61.0 in | Wt 177.0 lb

## 2024-03-14 DIAGNOSIS — G4733 Obstructive sleep apnea (adult) (pediatric): Secondary | ICD-10-CM | POA: Diagnosis not present

## 2024-03-14 NOTE — Patient Instructions (Addendum)
 Your Plan:  Try to use your CPAP nightly with at least 4 hours per night for optimal benefit   You will be contacted by DME to review different mask options for hopeful better tolerance     Follow-up in 1 year or call earlier if needed     Thank you for coming to see us  at [facility]. I hope we have been able to provide you high quality care today.  You may receive a patient satisfaction survey over the next few weeks. We would appreciate your feedback and comments so that we may continue to improve ourselves and the health of our patients.

## 2024-03-14 NOTE — Progress Notes (Signed)
 SABRA

## 2024-04-20 ENCOUNTER — Ambulatory Visit: Admitting: Adult Health

## 2025-03-14 ENCOUNTER — Ambulatory Visit: Admitting: Adult Health
